# Patient Record
Sex: Female | Born: 1960 | Race: White | Hispanic: No | Marital: Married | Smoking: Current every day smoker
Health system: Southern US, Community
[De-identification: ages and names within clinical notes are randomized; demographics above are authoritative.]

## PROBLEM LIST (undated history)

## (undated) DIAGNOSIS — K5090 Crohn's disease, unspecified, without complications: Secondary | ICD-10-CM

## (undated) DIAGNOSIS — M069 Rheumatoid arthritis, unspecified: Secondary | ICD-10-CM

## (undated) DIAGNOSIS — F329 Major depressive disorder, single episode, unspecified: Secondary | ICD-10-CM

## (undated) DIAGNOSIS — I2510 Atherosclerotic heart disease of native coronary artery without angina pectoris: Secondary | ICD-10-CM

## (undated) DIAGNOSIS — M797 Fibromyalgia: Secondary | ICD-10-CM

## (undated) DIAGNOSIS — F419 Anxiety disorder, unspecified: Secondary | ICD-10-CM

## (undated) DIAGNOSIS — F32A Depression, unspecified: Secondary | ICD-10-CM

## (undated) HISTORY — DX: Crohn's disease, unspecified, without complications: K50.90

## (undated) HISTORY — DX: Atherosclerotic heart disease of native coronary artery without angina pectoris: I25.10

## (undated) HISTORY — DX: Depression, unspecified: F32.A

## (undated) HISTORY — DX: Fibromyalgia: M79.7

## (undated) HISTORY — DX: Major depressive disorder, single episode, unspecified: F32.9

## (undated) HISTORY — DX: Rheumatoid arthritis, unspecified: M06.9

## (undated) HISTORY — DX: Anxiety disorder, unspecified: F41.9

## (undated) HISTORY — PX: SHOULDER SURGERY: SHX246

---

## 2001-08-14 ENCOUNTER — Encounter (INDEPENDENT_AMBULATORY_CARE_PROVIDER_SITE_OTHER): Payer: Self-pay | Admitting: Specialist

## 2001-08-15 ENCOUNTER — Encounter: Payer: Self-pay | Admitting: Emergency Medicine

## 2001-08-15 ENCOUNTER — Inpatient Hospital Stay (HOSPITAL_COMMUNITY): Admission: EM | Admit: 2001-08-15 | Discharge: 2001-08-20 | Payer: Self-pay | Admitting: Emergency Medicine

## 2002-10-15 ENCOUNTER — Encounter: Payer: Self-pay | Admitting: Emergency Medicine

## 2002-10-15 ENCOUNTER — Inpatient Hospital Stay (HOSPITAL_COMMUNITY): Admission: EM | Admit: 2002-10-15 | Discharge: 2002-10-16 | Payer: Self-pay | Admitting: Emergency Medicine

## 2003-03-06 ENCOUNTER — Ambulatory Visit (HOSPITAL_COMMUNITY): Admission: RE | Admit: 2003-03-06 | Discharge: 2003-03-06 | Payer: Self-pay | Admitting: *Deleted

## 2004-09-12 ENCOUNTER — Other Ambulatory Visit: Admission: RE | Admit: 2004-09-12 | Discharge: 2004-09-12 | Payer: Self-pay | Admitting: Family Medicine

## 2005-06-19 ENCOUNTER — Emergency Department (HOSPITAL_COMMUNITY): Admission: EM | Admit: 2005-06-19 | Discharge: 2005-06-19 | Payer: Self-pay | Admitting: Emergency Medicine

## 2005-09-09 ENCOUNTER — Ambulatory Visit (HOSPITAL_COMMUNITY): Admission: RE | Admit: 2005-09-09 | Discharge: 2005-09-09 | Payer: Self-pay | Admitting: Obstetrics and Gynecology

## 2005-09-09 ENCOUNTER — Encounter (INDEPENDENT_AMBULATORY_CARE_PROVIDER_SITE_OTHER): Payer: Self-pay | Admitting: *Deleted

## 2005-12-27 ENCOUNTER — Observation Stay (HOSPITAL_COMMUNITY): Admission: EM | Admit: 2005-12-27 | Discharge: 2005-12-27 | Payer: Self-pay | Admitting: Emergency Medicine

## 2006-01-01 ENCOUNTER — Inpatient Hospital Stay (HOSPITAL_BASED_OUTPATIENT_CLINIC_OR_DEPARTMENT_OTHER): Admission: RE | Admit: 2006-01-01 | Discharge: 2006-01-01 | Payer: Self-pay | Admitting: Interventional Cardiology

## 2006-08-21 ENCOUNTER — Encounter: Admission: RE | Admit: 2006-08-21 | Discharge: 2006-08-21 | Payer: Self-pay | Admitting: Rheumatology

## 2006-08-26 ENCOUNTER — Other Ambulatory Visit: Admission: RE | Admit: 2006-08-26 | Discharge: 2006-08-26 | Payer: Self-pay | Admitting: Family Medicine

## 2006-11-30 ENCOUNTER — Encounter: Admission: RE | Admit: 2006-11-30 | Discharge: 2006-11-30 | Payer: Self-pay | Admitting: Family Medicine

## 2007-01-14 ENCOUNTER — Encounter: Admission: RE | Admit: 2007-01-14 | Discharge: 2007-01-14 | Payer: Self-pay | Admitting: Family Medicine

## 2007-10-25 ENCOUNTER — Other Ambulatory Visit: Admission: RE | Admit: 2007-10-25 | Discharge: 2007-10-25 | Payer: Self-pay | Admitting: Obstetrics and Gynecology

## 2007-11-28 ENCOUNTER — Encounter (INDEPENDENT_AMBULATORY_CARE_PROVIDER_SITE_OTHER): Payer: Self-pay | Admitting: Obstetrics and Gynecology

## 2007-11-28 ENCOUNTER — Ambulatory Visit (HOSPITAL_COMMUNITY): Admission: RE | Admit: 2007-11-28 | Discharge: 2007-11-28 | Payer: Self-pay | Admitting: Obstetrics and Gynecology

## 2007-12-12 ENCOUNTER — Encounter: Admission: RE | Admit: 2007-12-12 | Discharge: 2007-12-12 | Payer: Self-pay | Admitting: Obstetrics and Gynecology

## 2008-06-08 ENCOUNTER — Ambulatory Visit (HOSPITAL_BASED_OUTPATIENT_CLINIC_OR_DEPARTMENT_OTHER): Admission: RE | Admit: 2008-06-08 | Discharge: 2008-06-08 | Payer: Self-pay | Admitting: Specialist

## 2008-10-25 ENCOUNTER — Other Ambulatory Visit: Admission: RE | Admit: 2008-10-25 | Discharge: 2008-10-25 | Payer: Self-pay | Admitting: Obstetrics and Gynecology

## 2010-07-29 LAB — BASIC METABOLIC PANEL
BUN: 11 mg/dL (ref 6–23)
CO2: 29 mEq/L (ref 19–32)
Calcium: 9.3 mg/dL (ref 8.4–10.5)
Chloride: 103 mEq/L (ref 96–112)
Creatinine, Ser: 0.63 mg/dL (ref 0.4–1.2)
GFR calc Af Amer: >60 mL/min (ref >60)
GFR calc non Af Amer: >60 mL/min (ref >60)
Glucose, Bld: 88 mg/dL (ref 70–99)
Potassium: 3.8 mEq/L (ref 3.5–5.1)
Sodium: 137 mEq/L (ref 135–145)

## 2010-07-29 LAB — CBC
HCT: 38.5 % (ref 36.0–46.0)
Hemoglobin: 12.8 g/dL (ref 12.0–15.0)
MCHC: 33.2 g/dL (ref 30.0–36.0)
MCV: 95.5 fL (ref 78.0–100.0)
Platelets: 167 10*3/uL (ref 150–400)
RBC: 4.04 MIL/uL (ref 3.87–5.11)
RDW: 15.1 % (ref 11.5–15.5)
WBC: 5.4 10*3/uL (ref 4.0–10.5)

## 2010-07-29 LAB — POCT PREGNANCY, URINE: Preg Test, Ur: NEGATIVE

## 2010-08-26 NOTE — H&P (Signed)
Allison Greer, Allison Greer                 ACCOUNT NO.:  [id number]   MEDICAL RECORD NO.:  [id number]         PATIENT TYPE:  AMB   LOCATION:                                FACILITY:  [facility]   PHYSICIAN:  Gerald Leitz, MD          DATE OF BIRTH:  11-15-1960   DATE OF ADMISSION:  DATE OF DISCHARGE:                              HISTORY & PHYSICAL   HISTORY OF PRESENT ILLNESS:  This is a 50 year old G2, P1-0-1-1 with  irregular menses and intermenstrual bleeding noted to have a hyperechoic  mass on the inferior wall of her endometrium noted on sonohystogram,  which measured 7 x 4 mm.  She is scheduled for hysteroscopy, D&C, and  removal of suspected endometrial polyp.   PAST MEDICAL HISTORY:  1. Coronary artery disease.  2. Fibromyalgia.  3. Crohn disease.  4. Anxiety.  5. Arthritis.  6. History of hypoglycemia.   PAST SURGICAL HISTORY:  1. Dilation and curettage in May of 2007.  2. Cardiac catheterization in September of 2007.   MEDICATIONS:  1. Xanax 1 mg.  2. Cymbalta 60 mg.  3. Lyrica 75 mg.  4. Norvasc 5 mg.  5. Aspirin 81 mg.  6. Nitroglycerin as needed.  7. Tramadol 50 mg.  8. ReQuip 5 mg.  9. Asacol 400 mg.   ALLERGIES:  Reported to be of MORPHINE, which causes itching.   PAST GYN HISTORY:  Personal history of HSV-2.  No history of abnormal  Pap smears, the last Pap smear was on October 25, 2007, this was normal.   PAST OBSTETRICS HISTORY:  Vaginal delivery x1, elective abortion x1.   SOCIAL HISTORY:  The patient is married.  She reports tobacco use, a  pack per day.  Denies alcohol use.  No illicit drug use.   FAMILY HISTORY:  Negative for breast, ovarian, or colon cancer.   REVIEW OF SYSTEMS:  Positive for incontinence.  She refused to followup  with urology.  Otherwise, negative except as stated in history of  present illness.   PHYSICAL EXAMINATION:  VITAL SIGNS:  Blood pressure 124/86, heart rate  80, and weight 126 pounds.  GENERAL:  Alert and oriented in no acute  distress.  CARDIOVASCULAR:  Regular rate and rhythm.  LUNGS:  Clear to auscultation bilaterally.  ABDOMEN:  Soft, nontender, and nondistended.  Positive bowel sounds.  EXTREMITIES:  No clubbing, cyanosis, or edema.  PELVIC:  Normal external female genitalia.  Normal vaginal cervical  lesions were noted.  Right adnexal fullness and tenderness on exam.   Ultrasound done on July 16, 2007, shows a uterus that measures 7.21 x  3.64 x 4.32 cm, endometrium was 0.2 cm, ovaries appeared bilaterally.  Sonohystogram performed on November 15, 2007, shows a hyperechoic mass on  the inferior wall of the endometrium measuring 7 x 4 mm consistent for  endometrial polyp.   IMPRESSION AND PLAN:  A 50 year old with intermenstrual bleeding,  irregular menses, intermenstrual bleeding was likely secondary to  endometrial polyp.  She is scheduled for hysteroscopy, D&C, and removal  of endometrial polyp.  The risks, benefits, and alternatives were  discussed with the patient including, but not limited to infection,  bleeding, possibility of uterus perforation, and also need for further  surgery.  The patient voiced understanding of risks and decided to  proceed with hysteroscopy, D&C, and removal of endometrial polyp, and we  will request Cardiac clearance with her history of coronary artery  disease from her cardiologist who is Dr. Eldridge Dace with [facility].      Gerald Leitz, MD  Electronically Signed     TC/MEDQ  D:  11/15/2007  T:  11/16/2007  Job:  [id number]

## 2010-08-26 NOTE — Op Note (Signed)
NAMEJULEAH, Allison Greer                 ACCOUNT NO.:  [id number]   MEDICAL RECORD NO.:  [id number]          PATIENT TYPE:  AMB   LOCATION:  SDC                           FACILITY:  [facility]   PHYSICIAN:  Gerald Leitz, MD          DATE OF BIRTH:  June 24, 1960   DATE OF PROCEDURE:  DATE OF DISCHARGE:                               OPERATIVE REPORT   PREOPERATIVE DIAGNOSES:  1. Irregular menses.  2. Suspected endometrial polyp.   POSTOPERATIVE DIAGNOSES:  1. Irregular menses.  2. Endometrial polyp.   PROCEDURES:  1. Hysteroscopy.  2. Dilation and curettage.  3. Resection of endometrial polyp.   SURGEON:  Gerald Leitz, MD   ASSISTANT:  None.   ANESTHESIA:  General.   FINDINGS:  Endometrial polyp.   SPECIMENS:  Endometrial curettings and endometrial resection looks like  the polyp.   DISPOSITION OF SPECIMEN:  Pathology.   ESTIMATED BLOOD LOSS:  Minimal.   COMPLICATIONS:  None.   SORBITOL DEFICIT:  100 mL.   PROCEDURE:  The patient was taken to the operating room where she was  placed under general anesthesia.  She was placed in dorsal lithotomy  position and then prepped and draped in the usual sterile fashion.  A  bivalve speculum was placed into the vaginal vault.  The anterior lip of  the cervix was grasped with a single-tooth tenaculum.  The uterus was  sounded to 6 cm.  The cervix was then dilated with a Hegar dilators to a  #14.  Hysteroscope was inserted with the findings noted above.  Attempts  were made to remove the endometrial polyp with polyp forceps.  This was  unsuccessful.  Decision was made to resect the polyp.  The cervix was  then dilated to #31.  Jackson-Pratt sharp curettage was performed.  Hysteroscope was inserted.  The endometrial polyp was resected without  difficulty.  There were no evidence of uterine perforation.  All  instruments were removed from vagina prior  to removal of instruments.  A 5 mL of 0.25% Marcaine were injected at  the 4 and 8 o'clock  position for postoperative anesthesia.  Then, all  instruments were removed from the vagina.  Sponge, lap, and needle  counts were correct x2.  The patient was taken to the recovery room  awake and in stable condition.      Gerald Leitz, MD  Electronically Signed     TC/MEDQ  D:  11/28/2007  T:  11/28/2007  Job:  [id number]

## 2010-08-26 NOTE — Op Note (Signed)
NAMENDEYE, TENORIO                 ACCOUNT NO.:  [id number]   MEDICAL RECORD NO.:  [id number]          PATIENT TYPE:  AMB   LOCATION:  [facility]                         FACILITY:  [facility]   PHYSICIAN:  Jene Every, M.D.    DATE OF BIRTH:  Jun 04, 1960   DATE OF PROCEDURE:  06/08/2008  DATE OF DISCHARGE:                               OPERATIVE REPORT   PREOPERATIVE DIAGNOSIS:  Adhesive capsulitis, impingement syndrome,  partial tear of the rotator cuff, right shoulder.   POSTOPERATIVE DIAGNOSIS:  Adhesive capsulitis, impingement syndrome,  partial tear of the rotator cuff, right shoulder, anterior labral tear.   PROCEDURE PERFORMED:  1. Examination under anesthesia with manipulation under anesthesia.  2. Right shoulder arthroscopy with debridement and shaving of the      anterior glenoid labral tear.  3. Subacromial decompression, bursectomy, acromioplasty, debridement      of partial tear of the rotator cuff.   ANESTHESIA:  General.   ASSISTANT:  Roma Schanz, P.A.   BRIEF HISTORY:  A 50 year old with history of shoulder pain, history of  cervical spondylosis, and fibromyalgia consisting of shoulder pain and  loss of range of motion.  She had an MRI indicating partial tear of the  rotator cuff.  Due to the consistent symptoms, despite conservative  treatment, subacromial corticosteroid injection, she had persistent  symptoms, we therefore discussed exam under anesthesia, manipulation,  shoulder arthroscopy, debridement with possible open rotator cuff  repair.  Risks and benefits were discussed, including bleeding,  infection, damage to vascular structures, suboptimal range of motion,  recurrent tear, persistent pain, etc.   TECHNIQUE:  Patient in a supine beach-chair position, after the  induction of adequate general anesthesia and 1 gm of Kefzol, the right  shoulder and upper extremity was prepped and draped in the usual sterile  fashion.  We arranged her shoulder, and she  actually had fairly  reasonable range of motion of the shoulder, lacked perhaps some forward  flexion, which was obtained full with gentle manipulation.  Next, a  surgical marker was utilized to delineate the acromion, AC joint, and  coracoid.  A standard posterolateral, anterolateral, and anterior  portals were utilized with an incision through the skin only.  With the  arm in a 70/30 position with retraction, the cannula was introduced into  the glenohumeral space, penetrated into the capsule atraumatically in  line with the coracoid.  There was an anterior tear of the labrum, was  not detached.  The shaver was introduced through the anterior portal  cannula, which was introduced just beneath the biceps tendon under  direct visualization.  This was then debrided to a stable base.  The  remainder of the labrum was intact.  The glenoid and humerus were  unremarkable.  The rotator cuff was not torn from beneath.  The subscap  was unremarkable as well.   The camera was then redirected in the subacromial space and the cannula  was then moved to the lateral entry, triangulated, and we viewed the  subacromial space, which noted was exuberant synovitis and bursitis.  The shaver was introduced and  utilized to perform a full bursectomy  anteriorly and posteriorly on top of the tendon.  The small fraying of  the tendon anterolateral.  This was debrided.  There was no significant  tearing of the rotator cuff noted.  We released and marginalized the CA  ligament, improving the subacromial space.  There was a small spur in  the anterolateral aspect of the acromion, which was removed and shaved  with a shaver.  Cautery was utilized to achieve hemostasis.  Full  reinspection of the cuff.  No significant tear requiring open repair.  Full bursectomy was noted, and a good subacromial space following the  subacromial decompression.   Next, all instrumentation was removed.  The portals were closed with  4-0  nylon simple sutures, and 0.25% Marcaine with epinephrine was  infiltrated into the joint.  The wound was dressed sterilely, placed in  a sling and extubated without difficulty, and transported to the  recovery room in stable condition.   Patient tolerated the procedure well.  No complications.      Jene Every, M.D.  Electronically Signed     JB/MEDQ  D:  06/08/2008  T:  06/08/2008  Job:  [id number]

## 2010-08-29 NOTE — Procedures (Signed)
[facility]  Patient:    Allison Greer, Allison Greer Visit Number: [id number] MRN: [id number]          Service Type: MED Location: [phone] Attending Physician:  Rosanne Sack Dictated by:   Everardo All. Madilyn Fireman, M.D. Proc. Date: 08/18/01 Admit Date:  08/14/2001   CC:         Rosanne Sack, M.D.   Procedure Report  PROCEDURE:  Colonoscopy with biopsy.  INDICATION FOR PROCEDURE:  Abdominal pain and fever with thickened colon on CT scan.  DESCRIPTION OF PROCEDURE:  The patient was placed in the left lateral decubitus position and placed on the pulse monitor with continuous low-flow oxygen delivered by nasal cannula.  She was sedated with 120 mg of IV Demerol and 12 mg of IV Versed.  The Olympus video colonoscope was inserted into the rectum and advanced to near the ileocecal valve but, due to difficulty sedating the patient and a lot of discomfort as well as CT findings suggestive of a normal cecum, I did not reach the cecum.  The ileocecal valve was seen at a distance, and the ascending colon appeared normal.  Somewhere in the mid transverse there was transition to diffuse edema and erythema, nodularity and intermittent deep ulcerations which persisted down to about 40 cm with the largest ulcers to be noted about 50-60 cm.  This was most consistent with Crohns disease, although other forms of colitis could not be ruled out. Multiple biopsies were taken.  There was a transition to normal mucosa from about 45 cm to 35 cm, and the sigmoid and rectum appeared normal.  No polyps or diverticula were seen.  The scope was then withdrawn, and the patient returned to the recovery room in stable condition.  She tolerated the procedure well, and there were no immediate complications.  IMPRESSION:  Segmental colitis, probably Crohns disease.  PLAN:  Will begin Solu-Medrol while continuing antibiotics for now and awaiting pathology report. Dictated by:   Everardo All Madilyn Fireman, M.D. Attending Physician:  Rosanne Sack DD:  08/18/01 TD:  08/19/01 Job: [id number]

## 2010-08-29 NOTE — Discharge Summary (Signed)
[facility]  Patient:    Allison Greer, Allison Greer Visit Number: [id number] MRN: [id number]          Service Type: MED Location: [phone] Attending Physician:  Allison Greer Dictated by:   Allison Greer, M.D. Admit Date:  08/14/2001 Discharge Date: 08/20/2001   CC:         Allison Greer, M.D.  Allison Greer, M.D.   Discharge Summary  DATE OF BIRTH:  1960/08/15  DISCHARGE DIAGNOSES: 1. Acute colitis secondary to probable Crohns disease (newly diagnosed). 2. Transient hypokalemia, resolved. 3. Smoking cessation addressed during this hospital stay. 4. Depression. 5. Anxiety. 6. Fibromyalgia. 7. Gastroesophageal reflux disease.  DISCHARGE MEDICATIONS: 1. Prednisone 40 mg p.o. q.d. x2 days, then 30 mg p.o. q.d. x4 days, then    20 mg p.o. q.d. x4 days, then 10 mg p.o. q.d. and stay on this dose. 2. Xanax 0.5 mg p.o. t.i.d. p.r.n. 3. Nicotine patch over-the-counter daily.  FOLLOWUP:  The patient will be followed by Dr. Dorena Cookey in four to six weeks from the GI standpoint.  During this followup visit, Dr. Madilyn Greer will start the patient on Asacol.  Further management of the acute colitis diagnosed during this hospital stay will be directed by Dr. Madilyn Greer.  Dr. Marinda Greer will continue to follow this patient from the general medicine standpoint.  CONSULTATION:  Dr. Madilyn Greer, [facility] Greer [facility].  PROCEDURES: 1. Computed tomography scan of the abdomen and pelvis consistent with    thickening of the transverse and descending colon.  No diverticuli. 2. Colonoscopy consistent with ulcerative colitis.  LABORATORY DATA AND X-RAY FINDINGS:  Pathology results consistent with Crohns disease versus ischemic colitis.  Stool studies negative.  Hemoglobin 11.6, MCV 90, WBC 5.6, platelets 253.  Sodium 137, potassium 3.8, chloride 109, CO2 27, BUN 1, creatinine 0.8, glucose 131.  LFTs within normal limits.  Urinary pregnancy test negative.  Urinalysis negative.   Wet-prep negative.  GC and chlamydia probe negative.  HOSPITAL COURSE:  The patient is a very pleasant, 50 year old female admitted to [facility] on Aug 14, 2001, with intractable abdominal pain. Please see admission H&P by Dr. Marcelino Duster for further details regarding the history of presentation, physical exam and lab data.  The patient was admitted with intractable abdominal pain.  In the emergency department, the patient had a negative Pap smear with negative GC and chlamydia screen test.  The patient had leukocytosis of 11,000 with left shift associated with a fever of 103.  A CT scan of the abdomen and pelvis were obtained in the emergency department revealing transverse and descending colon wall thickening.  The patient was started on empiric Cipro and Flagyl.  The stool studies were negative for ova and parasites and C. difficile toxin assay.  A GI consult was obtained for further input.  A colonoscopy was performed on Aug 18, 2001, revealing ulcerative colitis suspicious for Crohns disease.  The patient was started on intravenous Solu-Medrol with significant improvement of the patients abdominal pain.  Cipro and Flagyl were discontinued without complications.  The patient became afebrile.  There was no evidence of sepsis.  The day prior to discharge, the patient was started on prednisone after Solu-Medrol was discontinued.  The pathology report revealed patchy chronic active inflammation that extended into the lamina propria mixed with inflammatory cell infiltrates associated with mild distortion.  There were focal ulcerations extending into the muscular mucosa with exudates and a few abscesses.  There was some focal lamina  propria hemorrhage and surface epithelial necrosis.  No granuloma were identified, but chronic changes of inflammation suggested Crohns as most likely possibility.  Other etiologies like ischemia colitis could not be ruled out completely.  The  patient improved clinically with therapy described above.  Dr. Dorena Cookey agreed with prednisone therapy with followup in his office within four to six weeks.  The addition of Asacol will be determined by Dr. Madilyn Greer at the time of followup.  Otherwise, the patient had mild hypokalemia that responded to potassium supplementation with total correction of this mild problem.  On the day of discharge, the patients medical status was determined improved.  I spent less than 30 minutes on the discharge process of this patient. Dictated by:   Allison Greer, M.D. Attending Physician:  Allison Greer DD:  08/20/01 TD:  08/23/01 Job: [id number]

## 2010-08-29 NOTE — Discharge Summary (Signed)
NAMEBERENIZE, GATLIN                 ACCOUNT NO.:  [id number]   MEDICAL RECORD NO.:  [id number]          PATIENT TYPE:  OBV   LOCATION:  4703                         FACILITY:  [facility]   PHYSICIAN:  Melissa L. Ladona Ridgel, MD  DATE OF BIRTH:  May 26, 1960   DATE OF ADMISSION:  12/26/2005  DATE OF DISCHARGE:  12/27/2005                                 DISCHARGE SUMMARY   NOTE:  Please note the patient left against medical advice.   CHIEF COMPLAINT ON ADMISSION:  Chest pain and epistaxis.   DISCHARGING DIAGNOSES:  1. Atypical chest pain.  The patient refused to stay for cardiac      evaluation, although Dr. Donnie Aho was consulted.  The patient refused      further evaluation and left the hospital.  Her chest discomfort had      resolved as of this morning and she was incredibly anxious about being      in the hospital.  2. Fibromyalgia.  At baseline.  3. Chron's disease.  She had no evidence for diarrhea during this      admission and was maintained on her Asacol.  4. Severe anxiety.  The patient was maintained on her Xanax and Cymbalta      but to no avail.  She refused to stay in the hospital for further      evaluation.   HOSPITAL COURSE:  The patient is a 50 year old white female with known  fibromyalgia, anxiety disorder and Crohn's disease.  She was admitted to the  hospital yesterday after arriving at the hospital with one episode of  epistaxis and ongoing chest discomfort was noted to be sternal in location,  originally sharp in character and radiating to her back.  The patient was  admitted overnight.  She underwent serial cardiac markers which were  negative for any myocardial injury.  Her D-dimer was within normal limits.  Her lipid profile was also within normal limits.  The patient was seen and  evaluated this morning and was tearful and resistant to remaining in the  hospital for evaluation.  Our plan was to have Dr. Donnie Aho see her to  determine if it would be okay for her to have  an outpatient workup.  The  patient decided after speaking with myself and her daughter that she wished  to leave the hospital against medical advice.   PERTINENT LABORATORY VALUES:  All cardiac markers were within normal limits,  as stated her D-dimer was normal and her lipid profile was normal.  Her  urine pregnancy was also negative.  A urine drug screen was not obtained  however would be recommended in an outpatient setting if there is a  suspicion for possible illicit drug use.   ASSESSMENT AND PLAN:  The patient left the hospital against medical advice  and really should follow up with her primary care physician and have an  outpatient cardiac workup.      Melissa L. Ladona Ridgel, MD  Electronically Signed    MLT/MEDQ  D:  12/27/2005  T:  12/27/2005  Job:  [id number]   cc:  Robert L. Foy [place], M.D.

## 2010-08-29 NOTE — H&P (Signed)
Allison Greer, BRATCHER                 ACCOUNT NO.:  [id number]   MEDICAL RECORD NO.:  [id number]          PATIENT TYPE:  AMB   LOCATION:  SDC                           FACILITY:  [facility]   PHYSICIAN:  Gerald Leitz, MD          DATE OF BIRTH:  [birth year]   DATE OF ADMISSION:  DATE OF DISCHARGE:                                HISTORY & PHYSICAL   The patient is scheduled for surgery on Sep 09, 2005.   HISTORY OF PRESENT ILLNESS:  This is a 50 year old G2, P1-0-1-1 referred to  me by Dr. Marinda Elk with [name] [place] for evaluation of  irregular menses and dysmenorrhea.  The patient states that her periods have  been irregular for the past year.  She has had intermenstrual bleeding.  An  endometrial biopsy was attempted in the office but failed and she will  undergo a D & C for further evaluation to rule out hyperplasia or  malignancy.   OBSTETRIC/GYNECOLOGIC HISTORY:  1.  Questionable history of HSV.  2.  No history of abnormal Pap smears.  Last Pap smear was in June 2006.      This was normal.  3.  Spontaneous vaginal delivery x1, EAB x1.   PAST MEDICAL HISTORY:  1.  Fibromyalgia.  2.  Crohn's disease.  3.  Anxiety.  4.  Arthritis.   PAST SURGICAL HISTORY:  None.   MEDICATIONS:  1.  Xanax 1 mg t.i.d.  2.  Cymbalta 30 mg one daily.  3.  Tramadol 50 mg b.i.d.  4.  Asacol 400 mg t.i.d.   ALLERGIES:  MORPHINE, which the patient does have a true allergy to, since  it causes itching.   SOCIAL HISTORY:  The patient is married.  She is unemployed.  She smokes  approximately a pack per day.  Denies alcohol or illicit drug use.   FAMILY HISTORY:  Negative for breast, ovarian or colon cancer.   REVIEW OF SYSTEMS:  Negative except as stated in history of current illness.   PHYSICAL EXAMINATION:  VITAL SIGNS:  Blood pressure 116/78, heart rate 64,  weight 117 pounds  CARDIOVASCULAR:  Regular rate and rhythm.  LUNGS:  Clear to auscultation bilaterally.  ABDOMEN:   Soft, nontender, nondistended.  No masses.  PELVIC:  Normal external female genitalia.  No vulvar, vaginal or cervical  lesions noted.  Bimanual exam reveals a normal-sized uterus, no adnexal  masses or tenderness.  EXTREMITIES:  No clubbing, cyanosis or edema.   ASSESSMENT/PLAN:  Irregular menses with intermenstrual bleeding with  attempted endometrial biopsy that failed in the office, recommend dilatation  and curettage to rule out hyperplasia or malignancy.  Risks, benefits,  alternatives of dilatation and curettage were discussed with the patient  including but not limited to infection, bleeding, damage to the uterus,  uterine perforation with need for further surgery, risk of transfusion was  discussed as well as HIV, hepatitis B and C.  The patient understands all  risks and wishes to proceed with dilatation and curettage.      Gerald Leitz,  MD  Electronically Signed     TC/MEDQ  D:  08/31/2005  T:  08/31/2005  Job:  [id number]

## 2010-08-29 NOTE — H&P (Signed)
Allison Greer, Allison Greer                 ACCOUNT NO.:  [id number]   MEDICAL RECORD NO.:  [id number]          PATIENT TYPE:  OBV   LOCATION:  4703                         FACILITY:  [facility]   PHYSICIAN:  Jackie Plum, M.D.DATE OF BIRTH:  03/10/1961   DATE OF ADMISSION:  12/26/2005  DATE OF DISCHARGE:                                HISTORY & PHYSICAL   The patient is a 50 year old lady with history of fibromyalgia, Crohn's  disease, anxiety, arthritis who presented with a __________ day history of  chest pain.  The pain was said to be sternal and originally sharp in  character and radiating to her back.  It lasted several minutes and has been  intermittent.  There is no associated nausea, vomiting, diaphoresis, PND,  orthopnea, dizziness, syncope or presyncope episode.  She is not having any  cough, sputum production, any fever or chills.  In addition, she had an  epistaxis episode about 5 p.m. which resolved spontaneously.   PAST MEDICAL HISTORY:  Is noted for history of prior chest pain, status post  evaluation in 2003, with stress test which was said to be negative.  However, she had some Nitro-Bid which she takes intermittently over the last  4 years or so for chest pain and this relieves the chest pain.   FAMILY HISTORY:  Positive for heart disease.   SOCIAL HISTORY:  Patient smokes 1 pack of cigarettes daily.  She does not  drink alcohol.   REVIEW OF SYSTEMS:  Essentially as above, otherwise unremarkable.   She does not drink alcohol.  PATIENT IS ALLERGIC TO MORPHINE.   MEDICINES:  Asacol, Nitro-Bid, tramadol, acetaminophen and Xanax.   PHYSICAL EXAMINATION:  BP was 90/50, pulse 82, respirations 18, temp 99.1  degrees Fahrenheit, O2 sat 99%.  GENERAL EXAM:  The patient was not in acute cardiopulmonary distress.  HEENT:  Normocephalic, atraumatic.  Pupils were equal, round, reactive to  light.  Extraocular movements intact.  Oropharynx moist.  NECK:  Supple, no JVD.  CARDIAC:  Regular rate and rhythm, no gallops or murmur.  ABDOMEN:  Soft, nontender.  Bowel sounds present.  EXTREMITIES:  No cyanosis.  CNS EXAM:  Nonfocal.   LABS:  A 12 lead EKG shows sinus rhythm without any acute ST wave changes.   CBC was unremarkable.  BMET was unremarkable except for a slightly low  glucose of 66, BUN of 10.  Creatinine was 0.8.  Point of care cardiac  markers were negative.  UA was negative for any UTI.   IMPRESSION:  Chest pain in patient with coronary artery disease, risk  factors of severe smoking and family history.   Patient is admitted for serial cardiac enzymes and possibly Cardiology  evaluation to see whether she would benefit from a cardiac catheterization  if she rules out.  Will check her lipid panel.  Further recommendations will  be made as her database expands.  She will need to be seen by cigarette  cessation team.      Jackie Plum, M.D.  Electronically Signed     GO/MEDQ  D:  12/27/2005  T:  12/27/2005  Job:  [id number]

## 2010-08-29 NOTE — Op Note (Signed)
[name], Allison Greer                 ACCOUNT NO.:  [id number]   MEDICAL RECORD NO.:  [id number]          PATIENT TYPE:  AMB   LOCATION:  SDC                           FACILITY:  [facility]   PHYSICIAN:  Gerald Leitz, MD          DATE OF BIRTH:  November 15, 1960   DATE OF PROCEDURE:  09/09/2005  DATE OF DISCHARGE:                                 OPERATIVE REPORT   PREOPERATIVE DIAGNOSIS:  Irregular menses.   POSTOPERATIVE DIAGNOSIS:  Irregular menses.   PROCEDURE:  Dilation and sharp curettage.   SURGEON:  Gerald Leitz, MD   ASSISTANT:  None.   ANESTHESIA:  General.   COMPLICATIONS:  None.   SPECIMEN:  Endometrial curettings.   ESTIMATED BLOOD LOSS:  Minimal.   COMPLICATIONS:  None.   INDICATIONS:  This is a 50 year old with irregular menses and intermenstrual  bleeding, who an endometrial biopsy was unable to be performed in the office  and she was scheduled for dilation and curettage to rule out hyperplasia or  malignancy.   PROCEDURE:  The patient was taken to the operating room and placed under  general anesthesia.  She was prepped and draped in the usual sterile fashion  after being placed in the dorsal lithotomy position.  Her bladder was  drained with an in-and-out catheter.  Bivalved speculum was placed into the  vaginal vault.  The cervix was grasped anteriorly with a single-tooth  tenaculum.  The uterus sounded to 8 cm.  Dilation was performed up to a #18  Hegar dilator.  Sharp curettage was performed on the endometrium, all the  way around, with gritty texture noted all the way around.  Endometrial  curettings were sent to pathology.  All instruments were removed from the  patient's vagina.  Sponge, lap and instrument counts were correct x2.  The  patient was extubated and taken to the recovery room, awake and in a stable  condition.     Gerald Leitz, MD  Electronically Signed    TC/MEDQ  D:  09/09/2005  T:  09/09/2005  Job:  [id number]

## 2010-08-29 NOTE — Cardiovascular Report (Signed)
[name], Allison Greer                 ACCOUNT NO.:  [id number]   MEDICAL RECORD NO.:  [id number]          PATIENT TYPE:  OIB   LOCATION:  1961                         FACILITY:  [facility]   PHYSICIAN:  Corky Crafts, MDDATE OF BIRTH:  [birth year]   DATE OF PROCEDURE:  01/01/2006  DATE OF DISCHARGE:  01/01/2006                              CARDIAC CATHETERIZATION   REFERRING PHYSICIAN:  Molly Maduro L. Foy [place], M.D.   PROCEDURES PERFORMED:  1. Left heart catheterization.  2. Left ventriculogram.  3. Coronary angiogram.  4. Abdominal aortogram.   OPERATOR:  Corky Crafts, M.D.   INDICATIONS:  Chest pain.   PROCEDURE:  The patient was brought to the catheterization lab after the  risks and benefits of cardiac catheterization were explained, and informed  consent was obtained.  She was placed on the table, prepped and draped in  the usual sterile fashion. Her right groin was infiltrated with 1%  lidocaine.  A 4-French arterial sheath was placed into the right femoral  artery using modified Seldinger technique.  Left coronary artery angiography  was performed using JL4.0 catheter. The catheter was advanced to the vessel  ostium under fluoroscopic guidance.  Digital angiography was performed in  multiple projections using hand injection of contrast. The right coronary  artery was then attempted using a 3-DRC. The right coronary artery could not  be engaged.  An AR-1 catheter was then used successfully. Digital  angiography was performed using hand injection of contrast in multiple  projections.  The left heart catheterization was then performed using a  pigtail catheter.  Pigtail was advanced to the ascending aorta and across  the aortic valve under fluoroscopic guidance.  A power injection of contrast  done in the 30-degree RAO position.  A catheter pullback under continuous  hemodynamic pressure monitoring was then performed. The catheter was then  pulled back to the level of the  renal arteries.  The abdominal aortogram was  performed in AP projection.  The sheath was removed using manual  compression.   FINDINGS:  The left main is widely patent. There is a mild degree of what  was likely ostial catheter-induced spasm.   The left anterior descending was a large vessel with luminal irregularities.  The first diagonal is a small vessel.  The second diagonal is a medium-size  vessel with an ostial 40% stenosis.   The circumflex is medium-size vessel with an ostial 30% lesion. The OM-1 is  medium size with luminal irregularities.  The OM-2 is also medium size with  luminal irregularities.   The right coronary artery is a medium-size dominant vessel with luminal  irregularities.   The left ventriculogram showed normal ventricular function.  There is no  mitral regurgitation.   HEMODYNAMIC RESULTS:  Left ventricular pressure 94/3 with an LVEDP of 5  mmHg.  Aortic pressure was 99/50 with a mean aortic pressure of 77 mmHg.  The abdominal aortogram showed no abdominal aortic aneurysm.  There are  single renal arteries bilaterally.  There is no evidence of renal artery  stenosis.   IMPRESSION:  1. Nonobstructive coronary  artery disease  2. Normal left ventricular function.  3. No renal artery stenosis.   RECOMMENDATIONS:  Continue aggressive preventive therapy.  I would consider  aspirin. I would continue Toprol and start a statin, given that there is  already angiographic evidence of atherosclerosis.  The patient should also  stop smoking.      Corky Crafts, MD  Electronically Signed     JSV/MEDQ  D:  01/01/2006  T:  01/04/2006  Job:  [id number]

## 2011-01-09 LAB — URINALYSIS, ROUTINE W REFLEX MICROSCOPIC
Bilirubin Urine: NEGATIVE
Glucose, UA: NEGATIVE
Hgb urine dipstick: NEGATIVE
Ketones, ur: NEGATIVE
Nitrite: NEGATIVE
Protein, ur: NEGATIVE
Specific Gravity, Urine: <1.005 — ABNORMAL LOW
Urobilinogen, UA: 0.2
pH: 7

## 2011-01-09 LAB — CBC
HCT: 41.7
Hemoglobin: 13.9
MCHC: 33.4
MCV: 96.1
Platelets: 226
RBC: 4.34
RDW: 14.3
WBC: 8

## 2011-01-09 LAB — BASIC METABOLIC PANEL
BUN: 8
CO2: 29
Calcium: 9.1
Chloride: 103
Creatinine, Ser: 0.77
GFR calc Af Amer: >60
GFR calc non Af Amer: >60
Glucose, Bld: 127 — ABNORMAL HIGH
Potassium: 3.5
Sodium: 138

## 2013-04-28 ENCOUNTER — Other Ambulatory Visit: Payer: Self-pay | Admitting: Family Medicine

## 2013-04-28 DIAGNOSIS — M546 Pain in thoracic spine: Secondary | ICD-10-CM

## 2013-05-04 ENCOUNTER — Other Ambulatory Visit: Payer: Self-pay

## 2013-05-04 ENCOUNTER — Ambulatory Visit (INDEPENDENT_AMBULATORY_CARE_PROVIDER_SITE_OTHER): Payer: Self-pay

## 2013-05-04 ENCOUNTER — Other Ambulatory Visit: Payer: Self-pay | Admitting: Family Medicine

## 2013-05-04 DIAGNOSIS — R0602 Shortness of breath: Secondary | ICD-10-CM

## 2013-05-04 DIAGNOSIS — M546 Pain in thoracic spine: Secondary | ICD-10-CM

## 2013-05-04 DIAGNOSIS — R079 Chest pain, unspecified: Secondary | ICD-10-CM

## 2013-05-05 ENCOUNTER — Other Ambulatory Visit: Payer: Self-pay | Admitting: Family Medicine

## 2013-05-05 DIAGNOSIS — M546 Pain in thoracic spine: Secondary | ICD-10-CM

## 2013-05-09 ENCOUNTER — Ambulatory Visit (INDEPENDENT_AMBULATORY_CARE_PROVIDER_SITE_OTHER): Payer: Self-pay

## 2013-05-09 ENCOUNTER — Other Ambulatory Visit: Payer: Self-pay

## 2013-05-09 DIAGNOSIS — M546 Pain in thoracic spine: Secondary | ICD-10-CM

## 2013-05-19 ENCOUNTER — Ambulatory Visit: Payer: Self-pay | Admitting: Neurology

## 2013-05-22 ENCOUNTER — Encounter: Payer: Self-pay | Admitting: Neurology

## 2013-05-22 ENCOUNTER — Telehealth: Payer: Self-pay | Admitting: *Deleted

## 2013-05-22 ENCOUNTER — Ambulatory Visit (INDEPENDENT_AMBULATORY_CARE_PROVIDER_SITE_OTHER): Payer: Self-pay | Admitting: Neurology

## 2013-05-22 ENCOUNTER — Encounter: Payer: Self-pay | Admitting: *Deleted

## 2013-05-22 VITALS — BP 100/68 | HR 60 | Ht 63.0 in | Wt 105.0 lb

## 2013-05-22 DIAGNOSIS — R569 Unspecified convulsions: Secondary | ICD-10-CM | POA: Insufficient documentation

## 2013-05-22 DIAGNOSIS — G894 Chronic pain syndrome: Secondary | ICD-10-CM

## 2013-05-22 DIAGNOSIS — M549 Dorsalgia, unspecified: Secondary | ICD-10-CM | POA: Insufficient documentation

## 2013-05-22 NOTE — Telephone Encounter (Signed)
error 

## 2013-05-22 NOTE — Progress Notes (Signed)
NEUROLOGY CONSULTATION NOTE  [name] MRN: [id number] DOB: 03/12/1961  Referring provider: Dr. Briscoe Deutscher Primary care provider: Dr. Briscoe Deutscher  Reason for consult:  Back pain and possible seizure  Dear Dr Maceo Pro:  Thank you for your kind referral of Allison Greer for consultation of the above symptoms. Although her history is well known to you, please allow me to reiterate it for the purpose of our medical record. The patient was accompanied to the clinic by her husband and daughter who also provides collateral information.  Records from her PCP and [facility] ER were reviewed.  HISTORY OF PRESENT ILLNESS: This is a 53 year old right-handed woman with a history of significant anxiety, fibromyalgia, Crohn's disease, and arthritis, presenting for back pain and an episode of loss of consciousness a year ago with no neurology follow-up for the event.    1. Episode of loss of consciousness. On 05/28/12, she was at home and recalls going to the laundry room when she started feeling different.  She told her husband "something's wrong with me," then he reports that she started looking up, asking where the laundry was. She has no further recollection of events until she was at [facility] ER 3 hours later.  Her husband reports that she was trembling and went down to the ground slowly, with note of blood coming out her mouth.  No urinary incontinence.  They report her eyes were open throughout the episode but she was unresponsive.  At the ER, CBC, CMP, urine drug screen, urinalysis, alcohol level, EKG, and head CT were unremarkable.  She did not want to stay, and was discharged with a diagnosis of syncope and follow-up with neurology, however due to insurance issues, was unable to do this.  They deny any further similar episodes, however over the past few months, she has woken up with urinary incontinence, which is unusual for her.  Her husband denies any seizure-like episodes in sleep.  She  and her husband have noticed worsening short-term memory over the past few months, for instance she could not recall giving him money a few seconds prior, or if she put on deodorant after showering.  She gets confused easily.  She had some twitching under the left arm this morning, but no clear myoclonic jerks.  Epilepsy Risk Factors: She had a normal birth and early development. There is no history of febrile convulsions, CNS infections such as meningitis/encephalitis, significant traumatic brain injury, neurosurgical procedures, or family history of seizures.  2. Back pain.  She started having pain over the right lower thoracic region 3 months ago.  It occasionally radiates to the left side, but mostly stays on the right.  It does not affect the chest region.  She had noticed intermittent numbness in this region, initially lasting a few seconds, increasing in duration, now lasting for several hours at a time.  She denies any recent falls prior to the onset of pain.  She denies any cervical or lumbar pain.  She has occasional numbness and tingling in both hands.  She reports aching calves and feet cramps.  She has a diagnosis of fibromyalgia, with pain "moving all around."  She has rheumatoid arthritis with significant bilateral knee pain, sometimes waking up screaming in pain.  She has apparently tried several medications, they recall Neurontin, Savella, and several anti-depressants, Robaxin, which had caused "increased rage."  She is currently taking Lyrica 50mg , sometimes once a day, or every other day, higher doses make her "go into  rages" where she hits herself repeatedly on the head with her hands.  She does not take any pain medication due to constipation side effects.  An MRI of the thoracic spine without contrast was done on 05/09/2013. Images are unavailable for review. Per report, there is a small T1 hypointense, heterogenously increased T2 and STIR signal area in the central T4 vertebral body,  mildly sclerotic.  No surrounding marrow edema. No spinal stenosis. Thoracic disc spaces are within normal limits for age, minimal to mild occasional disc bulging, no discrete disc herniation, no thoracic foraminal stenosis. The small T4 vertebral body area of altered marrow signal favors an atypical appearance of a benign vertebral body hemangioma. She had a CT chest without contrast done in January 2015 with note of mild emphysema changes, tiny nodule felt to be scar tissue.  She denies a history of headaches, however recently has had an increase in dull frontal headaches occurring several times a week, no associated nausea, vomiting, photo/phonophobia.  She has occasional dizziness.  She notices occasional diplopia, where she needs to refocus.  No dysarthria/dysphagia.  She has significant anxiety, taking a total of 6mg  Xanax daily for several years, no recent dosage changes.  She gets anxious when she needs to leave the house, and does not drive.  At one time, she took an unrecalled medication that caused personality changes where she overdosed on the medication.   Records and images were personally reviewed where available.   PAST MEDICAL HISTORY: Past Medical History  Diagnosis Date  . Depression   . Fibromyalgia   . Anxiety   . Crohn's disease     Was seeing [facility](Been four years)   . Arthritis, rheumatoid     PAST SURGICAL HISTORY: Past Surgical History  Procedure Laterality Date  . Shoulder surgery      MEDICATIONS: No current outpatient prescriptions on file prior to visit.   No current facility-administered medications on file prior to visit.    ALLERGIES: Allergies  Allergen Reactions  . Morphine And Related Itching    FAMILY HISTORY: History reviewed. No pertinent family history.  SOCIAL HISTORY: History   Social History  . Marital Status: Married    Spouse Name: N/A    Number of Children: N/A  . Years of Education: N/A   Occupational  History  . Not on file.   Social History Main Topics  . Smoking status: Current Every Day Smoker  . Smokeless tobacco: Never Used  . Alcohol Use: No  . Drug Use: No  . Sexual Activity: Not on file   Other Topics Concern  . Not on file   Social History Narrative  . No narrative on file    REVIEW OF SYSTEMS: Constitutional: No fevers, chills, or sweats, + generalized fatigue, + weight loss, change in appetite Eyes: No visual changes, + double vision, - eye pain Ear, nose and throat: No hearing loss, ear pain, nasal congestion, sore throat Cardiovascular: No chest pain, palpitations Respiratory:  No shortness of breath at rest or with exertion, wheezes GastrointestinaI: No nausea, vomiting, + diarrhea, no abdominal pain, fecal incontinence  Genitourinary:  No dysuria, urinary retention or frequency Musculoskeletal:  No neck pain, lower back pain Integumentary: No rash, pruritus, skin lesions Neurological: as above Psychiatric: + depression, anxiety Endocrine: No palpitations, fatigue, diaphoresis,  Hematologic/Lymphatic:  No anemia, purpura, petechiae. Allergic/Immunologic: no itchy/runny eyes, nasal congestion, recent allergic reactions, rashes  PHYSICAL EXAM: Filed Vitals:   05/22/13 1257  BP: 100/68  Pulse: 60  General: tired-appearing, anxious. No acute distress Head:  Normocephalic/atraumatic Neck: supple, no paraspinal tenderness, full range of motion Back: No paraspinal tenderness, slight tenderness to palpation over the right lower posterior ribs (around T7-10) Heart: regular rate and rhythm Lungs: Clear to auscultation bilaterally. Vascular: No carotid bruits. Skin/Extremities: No rash, no edema Neurological Exam: Mental status: alert and oriented to person, place, and time, no dysarthria or dysphagia, intact fluency and comprehension. Cranial nerves: CN I: not tested CN II: pupils equal, round and reactive to light, visual fields intact, fundi  unremarkable. CN III, IV, VI:  full range of motion, no nystagmus, no ptosis CN V: facial sensation intact CN VII: upper and lower face symmetric CN VIII: hearing intact CN IX, X: gag intact, uvula midline CN XI: sternocleidomastoid and trapezius muscles intact CN XII: tongue midline Bulk & Tone: normal, no fasciculations. Motor: 5/5 throughout with no pronator drift. Sensation: intact to light touch, cold, pin, vibration and joint position sense.  No extinction to double simultaneous stimulation.  No sensory level noted.  Romberg test negative. Deep Tendon Reflexes: +2 throughout, no clonus Plantar responses: downgoing bilaterally Finger to nose testing: no incoordination Gait: narrow-based and steady, slight difficulty with tandem walk but able  IMPRESSION: This is a 53 year old right-handed woman with a history of fibromyalgia, depression, anxiety, Crohn's disease, presenting with a three-month history of right posterior lower thoracic pain, with tenderness to palpation around the right lower posterior ribs. Neurological exam is non-focal.  Imaging with an MRI of the thoracic spine and CT of the chest has been unrevealing, the T4 vertebral body finding is incidental, with no clear correlated symptoms at this time.  Pain is likely costochondritis or musculoskeletal in nature, possibly also related to her chronic pain syndrome. She has been very sensitive to pain medications, and will likely benefit from referral to a pain management specialist. With regards to the episode of loss of consciousness a year ago, considerations include syncope versus seizure. There have been further similar episodes since then, however she has had increasing confusion and memory problems and 2 unexplained episodes of waking up with urinary incontinence. An MRI of the brain with and without contrast and a routine EEG will be ordered to assess for underlying abnormalities at increased risk for recurrent seizures. There  is no clear indication to start antiepileptic medication at this time. She is not driving and understands [place] driving laws to stop driving after a seizure, until 6 months seizure-free. We also discussed her significant anxiety and fear of leaving the house, she will likely benefit from seeing a psychologist/psychiatrist, however she is resistant to this at this time.  Thank you for allowing me to participate in the care of this patient. Please do not hesitate to call for any questions or concerns.   Ellouise Newer, M.D.  CC: Dr. Briscoe Deutscher

## 2013-05-22 NOTE — Patient Instructions (Signed)
1. Schedule MRI brain with and without contrast 2. Schedule routine EEG 3. See pain management specialist

## 2013-05-25 NOTE — Telephone Encounter (Signed)
Pt's daughter returning a call to [facility]. Please call back at [phone] / Sherri S.

## 2013-05-26 NOTE — Telephone Encounter (Signed)
Noted  

## 2013-05-26 NOTE — Telephone Encounter (Signed)
Patients daughter called  Mrs Cope does not want to see  A pain management doctor at this time she will let the office know when she is ready to see one

## 2013-05-31 NOTE — Telephone Encounter (Signed)
Patient does not want to see a pain management at this time per daughter

## 2013-06-06 ENCOUNTER — Telehealth: Payer: Self-pay | Admitting: *Deleted

## 2013-06-06 NOTE — Telephone Encounter (Signed)
Noted  

## 2013-06-06 NOTE — Telephone Encounter (Signed)
Patient daughter called  06/06/13 and ask to reschedule her MRI and EEG this was rescheduled  To 06/23/13 at 845am and 10am

## 2013-06-07 ENCOUNTER — Telehealth: Payer: Self-pay | Admitting: *Deleted

## 2013-06-07 NOTE — Telephone Encounter (Signed)
Patient called this am 06/07/13 and ask me to reschedule her MRI and EEG until 06/26/13 so her daughter can take her

## 2013-06-08 ENCOUNTER — Ambulatory Visit (HOSPITAL_COMMUNITY): Payer: Self-pay

## 2013-06-08 ENCOUNTER — Ambulatory Visit (HOSPITAL_COMMUNITY): Admission: RE | Admit: 2013-06-08 | Payer: Self-pay | Source: Ambulatory Visit

## 2013-06-23 ENCOUNTER — Ambulatory Visit (HOSPITAL_COMMUNITY): Payer: Self-pay

## 2013-06-26 ENCOUNTER — Ambulatory Visit (HOSPITAL_COMMUNITY)
Admission: RE | Admit: 2013-06-26 | Discharge: 2013-06-26 | Disposition: A | Discharge status: Home / Self Care | Payer: Self-pay | Source: Ambulatory Visit | Attending: Neurology | Admitting: Neurology

## 2013-06-26 DIAGNOSIS — R569 Unspecified convulsions: Secondary | ICD-10-CM

## 2013-06-26 DIAGNOSIS — R229 Localized swelling, mass and lump, unspecified: Secondary | ICD-10-CM | POA: Insufficient documentation

## 2013-06-26 MED ORDER — GADOBENATE DIMEGLUMINE 529 MG/ML IV SOLN
10.0000 mL | Freq: Once | INTRAVENOUS | Status: AC | PRN
  Administered 2013-06-26: 10 mL via INTRAVENOUS

## 2013-06-26 NOTE — Progress Notes (Signed)
EEG Completed; Results Pending  

## 2013-06-26 NOTE — Procedures (Signed)
ELECTROENCEPHALOGRAM REPORT  Date of Study: 06/26/2013  Patient's Name: Allison Greer MRN: [id number] Date of Birth: 1960/06/07  Referring Provider: Dr. Ellouise Newer  Clinical History: This is a 53 year old woman with chronic pain and an episode of loss of consciousness a year ago.  She has had increasing confusion and memory problems and 2 unexplained episodes of waking up with urinary incontinence.    Medications: Lyrica, Xanax, Lialda  Technical Summary: A multichannel digital EEG recording measured by the international 10-20 system with electrodes applied with paste and impedances below 5000 ohms performed in our laboratory with EKG monitoring in an awake and drowsy patient.  Hyperventilation was not performed. Photic stimulation was performed.  The digital EEG was referentially recorded, reformatted, and digitally filtered in a variety of bipolar and referential montages for optimal display.  Spike detection software was employed.  Description: The patient is awake and drowsy during the recording.  During maximal wakefulness, there is a symmetric, medium voltage 10-10.5 Hz posterior dominant rhythm that attenuates with eye opening.  The record is symmetric.  There is an excess amount of diffuse low voltage beta activity seen throughout the recording. During drowsiness, there is an increase in theta slowing of the background. Photic stimulation did not elicit any abnormalities.  There were no epileptiform discharges or electrographic seizures seen.    EKG lead was unremarkable.  Impression: This awake and drowsy EEG is normal except for excess amount of diffuse low voltage beta activity.  Clinical Correlation: Diffuse low voltage beta activity is commonly seen with sedating medications such as benzodiazepines.  In the absence of sedating medications, anxiety and hyperthyroidism may produce generalized beta activity.  The absence of epileptiform discharges does not exclude a clinical  diagnosis of epilepsy.  If further clinical questions remain, prolonged EEG may be helpful.  Clinical correlation is advised.   Ellouise Newer, M.D.

## 2013-06-27 ENCOUNTER — Telehealth: Payer: Self-pay | Admitting: Neurology

## 2013-06-27 NOTE — Telephone Encounter (Signed)
Pt has some questions please call her at [phone]

## 2013-06-28 NOTE — Telephone Encounter (Signed)
Spoke with patient on yesterday with results of Mri brain patient would like for it to be released to My Chart for her own viewing.

## 2013-07-03 NOTE — Telephone Encounter (Signed)
MRI results released on MyChart.

## 2013-09-11 ENCOUNTER — Other Ambulatory Visit (HOSPITAL_COMMUNITY): Payer: Self-pay

## 2013-09-11 DIAGNOSIS — R0600 Dyspnea, unspecified: Secondary | ICD-10-CM

## 2013-09-18 ENCOUNTER — Ambulatory Visit (HOSPITAL_COMMUNITY): Admission: RE | Admit: 2013-09-18 | Payer: Self-pay | Source: Ambulatory Visit

## 2013-10-24 ENCOUNTER — Encounter: Payer: Self-pay | Admitting: Internal Medicine

## 2013-10-24 ENCOUNTER — Ambulatory Visit (INDEPENDENT_AMBULATORY_CARE_PROVIDER_SITE_OTHER): Payer: Self-pay | Admitting: Internal Medicine

## 2013-10-24 VITALS — BP 130/84 | HR 84 | Temp 99.2°F | Ht 63.5 in | Wt 107.0 lb

## 2013-10-24 DIAGNOSIS — R0600 Dyspnea, unspecified: Secondary | ICD-10-CM

## 2013-10-24 DIAGNOSIS — R0609 Other forms of dyspnea: Secondary | ICD-10-CM

## 2013-10-24 DIAGNOSIS — R911 Solitary pulmonary nodule: Secondary | ICD-10-CM

## 2013-10-24 DIAGNOSIS — R0989 Other specified symptoms and signs involving the circulatory and respiratory systems: Secondary | ICD-10-CM

## 2013-10-24 DIAGNOSIS — J432 Centrilobular emphysema: Secondary | ICD-10-CM

## 2013-10-24 DIAGNOSIS — J438 Other emphysema: Secondary | ICD-10-CM

## 2013-10-24 DIAGNOSIS — F17200 Nicotine dependence, unspecified, uncomplicated: Secondary | ICD-10-CM | POA: Insufficient documentation

## 2013-10-24 NOTE — Progress Notes (Signed)
   Subjective:    Patient ID: Allison Greer, female    DOB: 05-20-1960  MRN: [id number]  HPI  74 yowf smoker first noted in childhood never had ex tol as peers due to sob but gradually worse ex tol to point where is uncomfortable to top of 14 steps referred by Dr Maceo Pro to pulmonary clinic 10/24/2013    10/24/2013 1st [facility] office visit/ Allison Greer  Chief Complaint  Patient presents with  . Pulmonary Consult    Referred per Dr. Briscoe Deutscher. Pt c/o DOE x 6 months- gets out of breath with walking up 1 flight of stairs.  She also c/o CP and non prod cough on and off for the past 6 months.   gradual reduction in ex tol to point to point of sob top of steps,  Walking no problem at slow pace    Review of Systems  Constitutional: Negative for fever, chills and unexpected weight change.  HENT: Negative for congestion, dental problem, ear pain, nosebleeds, postnasal drip, rhinorrhea, sinus pressure, sneezing, sore throat, trouble swallowing and voice change.   Eyes: Negative for visual disturbance.  Respiratory: Positive for cough and shortness of breath. Negative for choking.   Cardiovascular: Positive for chest pain. Negative for leg swelling.  Gastrointestinal: Positive for abdominal pain. Negative for vomiting and diarrhea.  Genitourinary: Negative for difficulty urinating.  Musculoskeletal: Positive for arthralgias.  Skin: Negative for rash.  Neurological: Positive for headaches. Negative for tremors and syncope.  Hematological: Does not bruise/bleed easily.       Objective:   Physical Exam  amb wf nad  Wt Readings from Last 3 Encounters:  10/24/13 107 lb (48.535 kg)  05/22/13 105 lb (47.628 kg)      HEENT mild turbinate edema.  Oropharynx no thrush or excess pnd or cobblestoning.  No JVD or cervical adenopathy. Mild accessory muscle hypertrophy. Trachea midline, nl thryroid. Chest was hyperinflated by percussion with diminished breath sounds and moderate increased exp time  without wheeze. Hoover sign positive at mid inspiration. Regular rate and rhythm without murmur gallop or rub or increase P2 or edema.  Abd: no hsm, nl excursion. Ext warm without cyanosis or clubbing.        Assessment & Plan:

## 2013-10-24 NOTE — Assessment & Plan Note (Signed)

## 2013-10-24 NOTE — Patient Instructions (Signed)
I do recommend you have a follow up CT Jan 2016   You do have some emphysema but not enough to classify as COPD  The treatment for this severity is stop smoking   I recommend more regular exercise and consider CPST [facility] at [phone]

## 2013-10-24 NOTE — Progress Notes (Signed)
PFT done today. 

## 2013-10-24 NOTE — Assessment & Plan Note (Signed)
CT chest 05/04/13 . Moderate centrilobular emphysematous changes.   PFT's 10/24/2013  FEV1  2.37 (88%) raito 74 and no change p saba, DLCO 59 and corrects to 63   As I explained to this patient in detail:  although there may be emphysema present, it is not be clinically relevant:   it does not appear to be limiting activity tolerance any more than a set of worn tires limits someone from driving a car  around a parking lot.  A new set of Michelins might look good but would have no perceived impact on the performance of the car and would not be worth the cost.  To prove this she needs a cpst but declined today  In meantime, the only way to keep it from progressing is stop smoking (see sep a/p)

## 2013-10-24 NOTE — Assessment & Plan Note (Signed)
CT chest 05/04/13 2 x 4 mm nodule along the right minor fissure (series 3/ image 37),  favored to reflect nodular scarring > placed in tickle file for 05/04/14

## 2013-10-26 ENCOUNTER — Telehealth: Payer: Self-pay | Admitting: Internal Medicine

## 2013-10-26 NOTE — Telephone Encounter (Signed)
We had my moms appointment yesterday and I wanted to thank you again for helping [place] get all of the paperwork and everything beforehand it was a huge help. One question do you know how long it takes for her visit summary/lung function results to be posted on the patient portal. We were a bit confused as to why Dr Melvyn Novas stated that her test went great but yet she needed albuterol treatment while it was being completed. She was frantic and ready to go by the time we saw him so we were not sure who to ask about this. Thanks again!   I emailed her back and told her I was unable to discuss her  Mom over email and that I would send a msg back.

## 2013-10-26 NOTE — Telephone Encounter (Signed)
I spoke with the pt daughter and advised that giving albuterol with the PFT is standard procedure and is part of the test. I advised it does not determine whether the test went well or not. She states understanding. Also she wants a copy mailed to the pt home. Copy mailed. Drexel Bing, CMA

## 2013-10-30 LAB — PULMONARY FUNCTION TEST
DL/VA % pred: 63 %
DL/VA: 3.02 ml/min/mmHg/L
DLCO unc % pred: 59 %
DLCO unc: 13.92 ml/min/mmHg
FEF 25-75 Post: 1.97 L/sec
FEF 25-75 Pre: 1.97 L/sec
FEF2575-%Change-Post: 0 %
FEF2575-%Pred-Post: 76 %
FEF2575-%Pred-Pre: 76 %
FEV1-%Change-Post: 2 %
FEV1-%Pred-Post: 90 %
FEV1-%Pred-Pre: 88 %
FEV1-Post: 2.42 L
FEV1-Pre: 2.37 L
FEV1FVC-%Change-Post: 2 %
FEV1FVC-%Pred-Pre: 93 %
FEV6-%Change-Post: 3 %
FEV6-%Pred-Post: 97 %
FEV6-%Pred-Pre: 94 %
FEV6-Post: 3.21 L
FEV6-Pre: 3.11 L
FEV6FVC-%Change-Post: 0 %
FEV6FVC-%Pred-Post: 103 %
FEV6FVC-%Pred-Pre: 103 %
FVC-%Change-Post: 0 %
FVC-%Pred-Post: 94 %
FVC-%Pred-Pre: 94 %
FVC-Post: 3.21 L
FVC-Pre: 3.22 L
Post FEV1/FVC ratio: 76 %
Post FEV6/FVC ratio: 100 %
Pre FEV1/FVC ratio: 74 %
Pre FEV6/FVC Ratio: 100 %
RV % pred: 108 %
RV: 1.99 L
TLC % pred: 105 %
TLC: 5.23 L

## 2013-11-25 ENCOUNTER — Encounter: Payer: Self-pay | Admitting: *Deleted

## 2014-01-16 ENCOUNTER — Emergency Department (HOSPITAL_BASED_OUTPATIENT_CLINIC_OR_DEPARTMENT_OTHER)
Admission: EM | Admit: 2014-01-16 | Discharge: 2014-01-16 | Disposition: A | Discharge status: Other Acute Inpatient Hospital | Payer: 59 | Attending: Emergency Medicine | Admitting: Emergency Medicine

## 2014-01-16 ENCOUNTER — Inpatient Hospital Stay (HOSPITAL_COMMUNITY)
Admission: AD | Admit: 2014-01-16 | Discharge: 2014-01-19 | DRG: 885 | Disposition: A | Discharge status: Home / Self Care | Payer: 59 | Source: Intra-hospital | Attending: Psychiatry | Admitting: Psychiatry

## 2014-01-16 ENCOUNTER — Encounter (HOSPITAL_COMMUNITY): Payer: Self-pay | Admitting: *Deleted

## 2014-01-16 ENCOUNTER — Encounter (HOSPITAL_BASED_OUTPATIENT_CLINIC_OR_DEPARTMENT_OTHER): Payer: Self-pay | Admitting: Emergency Medicine

## 2014-01-16 DIAGNOSIS — Z599 Problem related to housing and economic circumstances, unspecified: Secondary | ICD-10-CM

## 2014-01-16 DIAGNOSIS — G4700 Insomnia, unspecified: Secondary | ICD-10-CM | POA: Diagnosis present

## 2014-01-16 DIAGNOSIS — M797 Fibromyalgia: Secondary | ICD-10-CM | POA: Diagnosis present

## 2014-01-16 DIAGNOSIS — Z825 Family history of asthma and other chronic lower respiratory diseases: Secondary | ICD-10-CM | POA: Diagnosis not present

## 2014-01-16 DIAGNOSIS — Z8249 Family history of ischemic heart disease and other diseases of the circulatory system: Secondary | ICD-10-CM | POA: Diagnosis not present

## 2014-01-16 DIAGNOSIS — Z8739 Personal history of other diseases of the musculoskeletal system and connective tissue: Secondary | ICD-10-CM | POA: Insufficient documentation

## 2014-01-16 DIAGNOSIS — F17210 Nicotine dependence, cigarettes, uncomplicated: Secondary | ICD-10-CM | POA: Diagnosis present

## 2014-01-16 DIAGNOSIS — I2510 Atherosclerotic heart disease of native coronary artery without angina pectoris: Secondary | ICD-10-CM | POA: Diagnosis present

## 2014-01-16 DIAGNOSIS — M069 Rheumatoid arthritis, unspecified: Secondary | ICD-10-CM | POA: Diagnosis present

## 2014-01-16 DIAGNOSIS — Z79899 Other long term (current) drug therapy: Secondary | ICD-10-CM | POA: Insufficient documentation

## 2014-01-16 DIAGNOSIS — Z720 Tobacco use: Secondary | ICD-10-CM | POA: Insufficient documentation

## 2014-01-16 DIAGNOSIS — F1320 Sedative, hypnotic or anxiolytic dependence, uncomplicated: Secondary | ICD-10-CM

## 2014-01-16 DIAGNOSIS — T50904A Poisoning by unspecified drugs, medicaments and biological substances, undetermined, initial encounter: Secondary | ICD-10-CM

## 2014-01-16 DIAGNOSIS — F411 Generalized anxiety disorder: Secondary | ICD-10-CM | POA: Diagnosis present

## 2014-01-16 DIAGNOSIS — F410 Panic disorder [episodic paroxysmal anxiety] without agoraphobia: Secondary | ICD-10-CM | POA: Diagnosis present

## 2014-01-16 DIAGNOSIS — Z8659 Personal history of other mental and behavioral disorders: Secondary | ICD-10-CM

## 2014-01-16 DIAGNOSIS — R45851 Suicidal ideations: Secondary | ICD-10-CM | POA: Insufficient documentation

## 2014-01-16 DIAGNOSIS — K5090 Crohn's disease, unspecified, without complications: Secondary | ICD-10-CM | POA: Insufficient documentation

## 2014-01-16 DIAGNOSIS — F332 Major depressive disorder, recurrent severe without psychotic features: Principal | ICD-10-CM | POA: Diagnosis present

## 2014-01-16 DIAGNOSIS — T424X2A Poisoning by benzodiazepines, intentional self-harm, initial encounter: Secondary | ICD-10-CM

## 2014-01-16 LAB — CBC WITH DIFFERENTIAL/PLATELET
BASOS PCT: 1 % (ref 0–1)
Basophils Absolute: 0 10*3/uL (ref 0.0–0.1)
EOS ABS: 0.4 10*3/uL (ref 0.0–0.7)
Eosinophils Relative: 6 % — ABNORMAL HIGH (ref 0–5)
HCT: 39.2 % (ref 36.0–46.0)
Hemoglobin: 12.9 g/dL (ref 12.0–15.0)
Lymphocytes Relative: 38 % (ref 12–46)
Lymphs Abs: 2.2 10*3/uL (ref 0.7–4.0)
MCH: 31.2 pg (ref 26.0–34.0)
MCHC: 32.9 g/dL (ref 30.0–36.0)
MCV: 94.9 fL (ref 78.0–100.0)
MONOS PCT: 7 % (ref 3–12)
Monocytes Absolute: 0.4 10*3/uL (ref 0.1–1.0)
NEUTROS PCT: 48 % (ref 43–77)
Neutro Abs: 2.8 10*3/uL (ref 1.7–7.7)
PLATELETS: 178 10*3/uL (ref 150–400)
RBC: 4.13 MIL/uL (ref 3.87–5.11)
RDW: 15 % (ref 11.5–15.5)
WBC: 5.7 10*3/uL (ref 4.0–10.5)

## 2014-01-16 LAB — COMPREHENSIVE METABOLIC PANEL
ALT: 7 U/L (ref 0–35)
ANION GAP: 11 (ref 5–15)
AST: 13 U/L (ref 0–37)
Albumin: 3.7 g/dL (ref 3.5–5.2)
Alkaline Phosphatase: 59 U/L (ref 39–117)
BUN: 8 mg/dL (ref 6–23)
CO2: 26 mEq/L (ref 19–32)
Calcium: 9 mg/dL (ref 8.4–10.5)
Chloride: 104 mEq/L (ref 96–112)
Creatinine, Ser: 0.8 mg/dL (ref 0.50–1.10)
GFR calc non Af Amer: 82 mL/min — ABNORMAL LOW (ref >90)
GLUCOSE: 81 mg/dL (ref 70–99)
Potassium: 3.7 mEq/L (ref 3.7–5.3)
SODIUM: 141 meq/L (ref 137–147)
TOTAL PROTEIN: 6.4 g/dL (ref 6.0–8.3)
Total Bilirubin: 0.3 mg/dL (ref 0.3–1.2)

## 2014-01-16 LAB — URINALYSIS, ROUTINE W REFLEX MICROSCOPIC
BILIRUBIN URINE: NEGATIVE
Glucose, UA: NEGATIVE mg/dL
HGB URINE DIPSTICK: NEGATIVE
Ketones, ur: NEGATIVE mg/dL
Nitrite: NEGATIVE
PH: 6 (ref 5.0–8.0)
PROTEIN: NEGATIVE mg/dL
Specific Gravity, Urine: 1.007 (ref 1.005–1.030)
Urobilinogen, UA: 0.2 mg/dL (ref 0.0–1.0)

## 2014-01-16 LAB — RAPID URINE DRUG SCREEN, HOSP PERFORMED
AMPHETAMINES: NOT DETECTED
BENZODIAZEPINES: POSITIVE — AB
Barbiturates: NOT DETECTED
COCAINE: NOT DETECTED
Opiates: NOT DETECTED
Tetrahydrocannabinol: NOT DETECTED

## 2014-01-16 LAB — URINE MICROSCOPIC-ADD ON

## 2014-01-16 LAB — ETHANOL: Alcohol, Ethyl (B): <11 mg/dL (ref 0–11)

## 2014-01-16 LAB — SALICYLATE LEVEL

## 2014-01-16 LAB — ACETAMINOPHEN LEVEL: Acetaminophen (Tylenol), Serum: <15 ug/mL (ref 10–30)

## 2014-01-16 MED ORDER — HYDROXYZINE HCL 25 MG PO TABS
25.0000 mg | ORAL_TABLET | Freq: Four times a day (QID) | ORAL | Status: DC | PRN
  Administered 2014-01-17: 25 mg via ORAL
  Filled 2014-01-16: qty 1

## 2014-01-16 MED ORDER — TRAZODONE HCL 50 MG PO TABS
50.0000 mg | ORAL_TABLET | Freq: Every evening | ORAL | Status: DC | PRN
  Filled 2014-01-16 (×4): qty 1

## 2014-01-16 MED ORDER — NITROGLYCERIN 0.4 MG SL SUBL: 0.4000 mg | SUBLINGUAL_TABLET | SUBLINGUAL | Status: DC | PRN

## 2014-01-16 MED ORDER — NICOTINE 21 MG/24HR TD PT24
21.0000 mg | MEDICATED_PATCH | Freq: Once | TRANSDERMAL | Status: DC
  Administered 2014-01-16: 21 mg via TRANSDERMAL
  Filled 2014-01-16: qty 1

## 2014-01-16 MED ORDER — LORAZEPAM 1 MG PO TABS
2.0000 mg | ORAL_TABLET | Freq: Once | ORAL | Status: AC
  Administered 2014-01-16: 2 mg via ORAL
  Filled 2014-01-16: qty 2

## 2014-01-16 MED ORDER — ACETAMINOPHEN 325 MG PO TABS: 650.0000 mg | ORAL_TABLET | Freq: Four times a day (QID) | ORAL | Status: DC | PRN

## 2014-01-16 MED ORDER — MESALAMINE 1.2 G PO TBEC
4.8000 g | DELAYED_RELEASE_TABLET | Freq: Every day | ORAL | Status: DC
  Administered 2014-01-17 – 2014-01-19 (×3): 4.8 g via ORAL
  Filled 2014-01-16 (×5): qty 4

## 2014-01-16 MED ORDER — PREGABALIN 50 MG PO CAPS
50.0000 mg | ORAL_CAPSULE | Freq: Three times a day (TID) | ORAL | Status: DC
  Filled 2014-01-16 (×3): qty 1

## 2014-01-16 MED ORDER — ALUM & MAG HYDROXIDE-SIMETH 200-200-20 MG/5ML PO SUSP: 30.0000 mL | ORAL | Status: DC | PRN

## 2014-01-16 MED ORDER — MAGNESIUM HYDROXIDE 400 MG/5ML PO SUSP
30.0000 mL | Freq: Every day | ORAL | Status: DC | PRN
  Administered 2014-01-17: 30 mL via ORAL

## 2014-01-16 NOTE — ED Notes (Signed)
Pt requesting discharge home instead of admission as recommended. Dr. Stark Jock in to talk to pt and family- Telepysch ordered for pt eval by psychiatrist by EDP- pt updated on plan of care

## 2014-01-16 NOTE — ED Notes (Signed)
Pt observed running from room down hallway during telepsych consult- security and charge nurse notified- pt walked back to room with ED staff- pt's daughter states that pt "freaked out because the psychiatrist told her she was going to a psych hospital". Pt in room and calmer at this time with sitter at bedside- Dr. Stark Jock filling out IVC paperwork per recommendation of tele-psych psychiatrist

## 2014-01-16 NOTE — Tx Team (Signed)
Initial Interdisciplinary Treatment Plan   PATIENT STRESSORS: Financial difficulties Health problems Occupational concerns   PROBLEM LIST: Problem List/Patient Goals Date to be addressed Date deferred Reason deferred Estimated date of resolution  "suicidal ideation" 01/16/2014   D/c        "anxiety" 01/16/2014   D/c        "depression" 01/16/2014   D/c                           DISCHARGE CRITERIA:  Ability to meet basic life and health needs Adequate post-discharge living arrangements Improved stabilization in mood, thinking, and/or behavior Medical problems require only outpatient monitoring Motivation to continue treatment in a less acute level of care Need for constant or close observation no longer present Reduction of life-threatening or endangering symptoms to within safe limits Safe-care adequate arrangements made Verbal commitment to aftercare and medication compliance Withdrawal symptoms are absent or subacute and managed without 24-hour nursing intervention  PRELIMINARY DISCHARGE PLAN: Attend aftercare/continuing care group Attend PHP/IOP Outpatient therapy Return to previous living arrangement  PATIENT/FAMIILY INVOLVEMENT: This treatment plan has been presented to and reviewed with the patient, Allison Greer, and patient would like treatment for her depression and anxiety.  The patient and family have been given the opportunity to ask questions and make suggestions.  [name] 01/16/2014, 7:29 PM

## 2014-01-16 NOTE — ED Notes (Signed)
Pts jewelry: earrings and 2 rings given to family.
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
## 2014-01-16 NOTE — ED Notes (Signed)
Pt to Ed co suicidal ideations. Pt has Hx of depression for which she takes antidepressants. Pt states that her meds are causing the suicidal ideations. Pt presents with flat and sad mood. denies alcohol intake or recreational drugs.dughter and brother at bedside.
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
## 2014-01-16 NOTE — ED Provider Notes (Signed)
CSN: [id number]     Arrival date & time 01/16/14  1010 History   First MD Initiated Contact with Patient 01/16/14 1030     Chief Complaint  Patient presents with  . Suicidal     (Consider location/radiation/quality/duration/timing/severity/associated sxs/prior Treatment) HPI Comments: Patient is a 53 year old female with history of anxiety and depression.  She presents today for evaluation of increased suicidal feelings.  She tells me that she took 32 mg Ativan tablets yesterday evening in an attempt to harm herself. She was brought by her daughter for psychiatric evaluation and possible admission.  Patient is a 53 y.o. female presenting with mental health disorder. The history is provided by the patient.  Mental Health Problem Presenting symptoms: suicidal thoughts   Degree of incapacity (severity):  Moderate Onset quality:  Sudden Timing:  Constant Progression:  Worsening Chronicity:  New Treatment compliance:  Untreated Relieved by:  Nothing Worsened by:  Nothing tried   Past Medical History  Diagnosis Date  . Depression   . Fibromyalgia   . Anxiety   . Crohn's disease     Was seeing [facility](Been four years)   . Arthritis, rheumatoid   . CAD (coronary artery disease)   . Anxiety    Past Surgical History  Procedure Laterality Date  . Shoulder surgery     Family History  Problem Relation Age of Onset  . Emphysema Sister     smoked  . Heart disease Father   . Heart disease Sister     2   . Heart disease Brother    History  Substance Use Topics  . Smoking status: Current Every Day Smoker -- 1.00 packs/day for 40 years    Types: Cigarettes  . Smokeless tobacco: Never Used  . Alcohol Use: No   OB History   Grav Para Term Preterm Abortions TAB SAB Ect Mult Living                 Review of Systems  Psychiatric/Behavioral: Positive for suicidal ideas.  All other systems reviewed and are negative.     Allergies  Adhesive and Morphine and  related  Home Medications   Prior to Admission medications   Medication Sig Start Date End Date Taking? Authorizing Provider  alprazolam Duanne Moron) 2 MG tablet Take 2 mg by mouth 3 (three) times daily.     Historical Provider, MD  mesalamine (LIALDA) 1.2 G EC tablet 1.2 g. Takes four tablets by mouth once daily    Historical Provider, MD  nitroGLYCERIN (NITROSTAT) 0.4 MG SL tablet Place 0.4 mg under the tongue as needed for chest pain.    Historical Provider, MD  pregabalin (LYRICA) 50 MG capsule Take 50 mg by mouth 3 (three) times daily as needed.     Historical Provider, MD   BP 113/83  Pulse 89  Temp(Src) 98.2 F (36.8 C) (Oral)  Resp 18  Ht 5\' 3"  (1.6 m)  Wt 103 lb (46.72 kg)  BMI 18.25 kg/m2  SpO2 96% Physical Exam  Nursing note and vitals reviewed. Constitutional: She is oriented to person, place, and time. She appears well-developed and well-nourished. No distress.  HENT:  Head: Normocephalic and atraumatic.  Neck: Normal range of motion. Neck supple.  Cardiovascular: Normal rate and regular rhythm.  Exam reveals no gallop and no friction rub.   No murmur heard. Pulmonary/Chest: Effort normal and breath sounds normal. No respiratory distress. She has no wheezes.  Abdominal: Soft. Bowel sounds are normal. She exhibits no distension. There  is no tenderness.  Musculoskeletal: Normal range of motion.  Neurological: She is alert and oriented to person, place, and time.  Skin: Skin is warm and dry. She is not diaphoretic.  Psychiatric: Her speech is normal. She is withdrawn. She expresses impulsivity. She exhibits a depressed mood. She expresses suicidal ideation.    ED Course  Procedures (including critical care time) Labs Review Labs Reviewed  CBC WITH DIFFERENTIAL - Abnormal; Notable for the following:    Eosinophils Relative 6 (*)    All other components within normal limits  URINALYSIS, ROUTINE W REFLEX MICROSCOPIC - Abnormal; Notable for the following:    Leukocytes,  UA TRACE (*)    All other components within normal limits  URINE MICROSCOPIC-ADD ON - Abnormal; Notable for the following:    Bacteria, UA FEW (*)    All other components within normal limits  COMPREHENSIVE METABOLIC PANEL  URINE RAPID DRUG SCREEN (HOSP PERFORMED)  ETHANOL  ACETAMINOPHEN LEVEL  SALICYLATE LEVEL    Imaging Review No results found.   EKG Interpretation None      MDM   Final diagnoses:  None    Patient brought by family for evaluation after an apparent suicide attempt.  She has a long-standing history of depression, agoraphobia, and fibromyalgia.  She took an overdose of benzodiazepines last night.  Workup today is unremarkable.    After consultation with TTS, admission for inpatient psychiatric care has been recommended.  The patient and family have requested to be discharged.  Further consultation with Telepsych has upheld my opinion that she is in need of inpatient treatment and further evaluation.  After speaking with Telepsych, the patient attempted to flee the ED.  An IVC has been initiated and the patient will be admitted to [facility].  This has been somewhat a difficult situation as the daughter and patient herself have been somewhat uncooperative.  The patient wants to leave, however I am uncomfortable with this.  The daughter also became argumentative and somewhat verbally abusive when asked not to have her cell phone in her mother's treatment area due to hospital policy.    Veryl Speak, MD 01/16/14 [phone]

## 2014-01-17 DIAGNOSIS — F411 Generalized anxiety disorder: Secondary | ICD-10-CM

## 2014-01-17 DIAGNOSIS — F1994 Other psychoactive substance use, unspecified with psychoactive substance-induced mood disorder: Secondary | ICD-10-CM

## 2014-01-17 LAB — TSH: TSH: 1.13 u[IU]/mL (ref 0.350–4.500)

## 2014-01-17 LAB — HEMOGLOBIN A1C
Hgb A1c MFr Bld: 5.2 % (ref <5.7)
Mean Plasma Glucose: 103 mg/dL (ref <117)

## 2014-01-17 LAB — LIPID PANEL
CHOLESTEROL: 171 mg/dL (ref 0–200)
HDL: 46 mg/dL (ref >39)
LDL CALC: 110 mg/dL — AB (ref 0–99)
Total CHOL/HDL Ratio: 3.7 RATIO
Triglycerides: 76 mg/dL (ref <150)
VLDL: 15 mg/dL (ref 0–40)

## 2014-01-17 MED ORDER — NICOTINE 21 MG/24HR TD PT24
21.0000 mg | MEDICATED_PATCH | Freq: Every day | TRANSDERMAL | Status: DC
  Administered 2014-01-17 – 2014-01-19 (×3): 21 mg via TRANSDERMAL
  Filled 2014-01-17 (×5): qty 1

## 2014-01-17 MED ORDER — ENSURE COMPLETE PO LIQD: 237.0000 mL | ORAL | Status: DC

## 2014-01-17 MED ORDER — CARBAMAZEPINE ER 200 MG PO TB12
200.0000 mg | ORAL_TABLET | Freq: Every day | ORAL | Status: DC
  Administered 2014-01-17 – 2014-01-19 (×3): 200 mg via ORAL
  Filled 2014-01-17 (×6): qty 1

## 2014-01-17 MED ORDER — MIRTAZAPINE 15 MG PO TABS
15.0000 mg | ORAL_TABLET | Freq: Every day | ORAL | Status: DC
  Filled 2014-01-17 (×2): qty 1

## 2014-01-17 MED ORDER — CHLORDIAZEPOXIDE HCL 25 MG PO CAPS
25.0000 mg | ORAL_CAPSULE | Freq: Four times a day (QID) | ORAL | Status: DC
  Administered 2014-01-17 – 2014-01-19 (×8): 25 mg via ORAL
  Filled 2014-01-17 (×8): qty 1

## 2014-01-17 NOTE — Progress Notes (Signed)
D   Pt took her medications late because she was asleep and wouldn't respond when her name was called   Pt came later but refused prescribed medications because she said they dont agree with her and she rages sometimes when she takes them   Pt requested xanax but it was explained since she overdosed on same she would probably not get that medication   She did request a cup of gingerale A   Verbal support given   Medications educated on and offered   Q 15 min checks R   Pt safe at present and verbalized understanding

## 2014-01-17 NOTE — Progress Notes (Signed)
D: Pt presents with flat affect, depressed mood, minimal interaction on the unit and poor insight for treatment. Pt requesting to be discharged. Pt feeling remorseful for attempting to overdose on pills. Pt anxious this morning, fidgety and pacing. Pt refused to take Lyrica this morning and afternoon.  A: Medications administered as ordered per MD. Verbal support given. Pt encouraged to attend groups. 15 minute checks performed for safety.  R: Pt safety maintained at this time.
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
## 2014-01-17 NOTE — Progress Notes (Signed)
NUTRITION ASSESSMENT  Pt identified as at risk on the Malnutrition Screen Tool  INTERVENTION: 1. Educated patient on the importance of nutrition and encouraged intake of food and beverages. 2. Discussed weight goals. 3. Supplements: Ensure Complete po once daily, each supplement provides 350 kcal and 13 grams of protein   NUTRITION DIAGNOSIS: Unintentional weight loss related to sub-optimal intake as evidenced by pt report.   Goal: Pt to meet >/= 90% of their estimated nutrition needs.  Monitor:  PO intake  Assessment:  Patient admitted with SA, borderline personality disorder.   Patient reports hx of chron's disease with occasional flair ups.  Can not drink milk, greasy foods, or foods with a lot of fiber.  States that her appetite and intake currently and prior to admit are fair.  Often eats only 2 meals per day and 1-2 snacks secondary to decreased appetite.  Weight overall has been stable.    53 y.o. female  Height: Ht Readings from Last 1 Encounters:  01/16/14 5\' 2"  (1.575 m)    Weight: Wt Readings from Last 1 Encounters:  01/16/14 104 lb 12 oz (47.514 kg)    Weight Hx: Wt Readings from Last 10 Encounters:  01/16/14 104 lb 12 oz (47.514 kg)  01/16/14 103 lb (46.72 kg)  10/24/13 107 lb (48.535 kg)  05/22/13 105 lb (47.628 kg)    BMI:  Body mass index is 19.15 kg/(m^2). Pt meets criteria for normal weight based on current BMI.  Estimated Nutritional Needs: Kcal: 25-30 kcal/kg Protein: > 1 gram protein/kg Fluid: 1 ml/kcal  Diet Order: General Pt is also offered choice of unit snacks mid-morning and mid-afternoon.  Pt is eating as desired.   Lab results and medications reviewed.   Antonieta Iba, RD, LDN Clinical Inpatient Dietitian Pager:  [phone] Weekend and after hours pager:  [phone]

## 2014-01-17 NOTE — BHH Group Notes (Signed)
[facility] LCSW Group Therapy  Emotional [facility] 1:15 - 2: 30 PM        01/17/2014  2:42 PM    Type of Therapy:  Group Therapy  Participation Level:  Patient did not attend group - in bed.   Concha Pyo 01/17/2014 2:42 PM

## 2014-01-18 DIAGNOSIS — F410 Panic disorder [episodic paroxysmal anxiety] without agoraphobia: Secondary | ICD-10-CM

## 2014-01-18 DIAGNOSIS — F411 Generalized anxiety disorder: Secondary | ICD-10-CM | POA: Diagnosis present

## 2014-01-18 NOTE — Progress Notes (Signed)
[facility] Group Notes:  (Nursing/MHT/Case Management/Adjunct)  Date:  01/18/2014  Time:  2100  Type of Therapy:  wrap up group  Participation Level:  Active  Participation Quality:  Appropriate, Attentive and Sharing  Affect:  Anxious and Flat  Cognitive:  Appropriate  Insight:  Improving  Engagement in Group:  Engaged  Modes of Intervention:  Clarification, Education and Support  Summary of Progress/Problems: Pt reported the medicine helping her to come off of her xanax.  Pt reports being ready to discharge tomorrow. Pt shared she was in a dark place and wants to socialize and get out more. Pt shared that her anxiety got so bad that she was only going to the store, 1 mile away from her home, once a week. Pt is also planning on seeing a psychologist or psychiatrist twice a month.   Allison Greer 01/18/2014, 10:28 PM

## 2014-01-18 NOTE — BHH Group Notes (Signed)
[facility] L[facility] Group Therapy  [facility] 1:15 - 2:30 PM  01/18/2014   Type of Therapy:  Group Therapy  Participation Level: Active  Participation Quality:  Attentive  Affect:  Appropriate  Cognitive:  Appropriate  Insight:  Developing/Improving   Engagement in Therapy:  Developing/Improving   Modes of Intervention:  Discussion, Education, Exploration, Problem-Solving, Rapport Building, Support   Summary of Progress/Problems:   Patient was attentive to speaker from the [facility] as he shared his story of dealing with mental health/substance abuse issues and overcoming it by working a recovery program.  Patient expressed interest in their programs and services and received information on their agency.  She thanked the speaker for his presentation.  She shared she will always remember that "d" added to anger leads to "danger."  Concha Pyo 01/18/2014

## 2014-01-18 NOTE — Progress Notes (Signed)
Patient ID: Allison Greer, female   DOB: [birth year], 53 y.o.   MRN: [id number] D: Patient in room on approach. Pt stated her goal is to accept her present situation and not to feel guilty for not been able to a lot. Pt mood/affect depressed and sad. Pt denies SI/HI/AVH and pain. Pt denies any needs or concerns.  Cooperative with assessment. No acute distressed noted at this time.   A: Met with pt 1:1. Medications administered as prescribed. Writer encouraged pt to discuss feelings. Pt encouraged to come to staff with any question or concerns.   R: Patient remains safe. She is complaint with medications and denies any adverse reaction. Continue current POC.

## 2014-01-18 NOTE — Plan of Care (Signed)
Problem: Diagnosis: Increased Risk For Suicide Attempt Goal: STG-Patient Will Comply With Medication Regime Outcome: Progressing Pt compliant with medication

## 2014-01-18 NOTE — Progress Notes (Signed)
Pt has been up and active in the milieu today. She rated her depression and anxiety both a 4 and denied any hopelessness on her self-inventory. She denied any S/H ideation or A/V/H. She plans to go to her PCP to let him find her a therapist and psychiatrist.

## 2014-01-18 NOTE — Progress Notes (Signed)
[facility] MD Progress Note  01/18/2014 1:54 PM Allison Greer  MRN:  [id number] Subjective:  Admits she feels more clear headed right now. She continues to affirm that all the antidepressants including Remeron have caused agitation so she did not take it last night. She has tolerated the Librium 25 mg up to four times a day well. States that the Xanax she had to be taking every 2 hours. She states she was wanting to get off it and she welcomes this plan. She is willing to continue to take the Tegretol knowing that it is a mood stabilizer and not an antidepressant. She understands that she will have to be weaned the Librium slowly ( concerned about going to fast given that she has been on Xanax up to 6 mg for years. She states she will like to be on the least amount of medications as possible. She recognizes she needs to see a therapist to start dealing with all her losses: family members, dog, her physical health Diagnosis:   DSM5: Depressive Disorders:  Major Depressive Disorder - Severe (296.23) Total Time spent with patient: 30 minutes  Axis I: Panic Disorder  ADL's:  Intact  Sleep: Fair  Appetite:  Fair   Psychiatric Specialty Exam: Physical Exam  Review of Systems  HENT: Negative.   Eyes: Negative.   Respiratory: Negative.   Cardiovascular: Negative.   Gastrointestinal: Negative.   Genitourinary: Negative.   Musculoskeletal: Positive for joint pain and myalgias.  Skin: Negative.   Neurological: Positive for weakness.  Endo/Heme/Allergies: Negative.   Psychiatric/Behavioral: Positive for depression and substance abuse. The patient is nervous/anxious.     Blood pressure 137/91, pulse 89, temperature 98.1 F (36.7 C), temperature source Oral, resp. rate 16, height 5\' 2"  (1.575 m), weight 47.514 kg (104 lb 12 oz), last menstrual period 04/14/2007.Body mass index is 19.15 kg/(m^2).  General Appearance: Fairly Groomed  Engineer, water::  Fair  Speech:  Clear and Coherent  Volume:  Decreased   Mood:  Anxious and sad, worried  Affect:  Appropriate  Thought Process:  Coherent and Goal Directed  Orientation:  Full (Time, Place, and Person)  Thought Content:  events symptoms worries concerns  Suicidal Thoughts:  No  Homicidal Thoughts:  No  Memory:  Immediate;   Fair Recent;   Fair Remote;   Fair  Judgement:  Fair  Insight:  Present  Psychomotor Activity:  Restlessness  Concentration:  Fair  Recall:  [employer] of Knowledge:NA  Language: Fair  Akathisia:  No  Handed:    AIMS (if indicated):     Assets:  Desire for Improvement  Sleep:  Number of Hours: 2.5   Musculoskeletal: Strength & Muscle Tone: within normal limits Gait & Station: normal Patient leans: N/A  Current Medications: Current Facility-Administered Medications  Medication Dose Route Frequency Provider Last Rate Last Dose  . acetaminophen (TYLENOL) tablet 650 mg  650 mg Oral Q6H PRN Laverle Hobby, PA-C      . alum & mag hydroxide-simeth (MAALOX/MYLANTA) 200-200-20 MG/5ML suspension 30 mL  30 mL Oral Q4H PRN Laverle Hobby, PA-C      . carbamazepine (TEGRETOL XR) 12 hr tablet 200 mg  200 mg Oral Daily Nicholaus Bloom, MD   200 mg at 01/18/14 7829  . chlordiazePOXIDE (LIBRIUM) capsule 25 mg  25 mg Oral QID Nicholaus Bloom, MD   25 mg at 01/18/14 1132  . feeding supplement (ENSURE COMPLETE) (ENSURE COMPLETE) liquid 237 mL  237 mL Oral Q24H  Darrol Jump, RD      . hydrOXYzine (ATARAX/VISTARIL) tablet 25 mg  25 mg Oral Q6H PRN Laverle Hobby, PA-C   25 mg at 01/17/14 0867  . magnesium hydroxide (MILK OF MAGNESIA) suspension 30 mL  30 mL Oral Daily PRN Laverle Hobby, PA-C   30 mL at 01/17/14 2005  . mesalamine (LIALDA) EC tablet 4.8 g  4.8 g Oral Q breakfast Laverle Hobby, PA-C   4.8 g at 01/18/14 6195  . nicotine (NICODERM CQ - dosed in mg/24 hours) patch 21 mg  21 mg Transdermal Daily Nicholaus Bloom, MD   21 mg at 01/18/14 0825  . nitroGLYCERIN (NITROSTAT) SL tablet 0.4 mg  0.4 mg Sublingual PRN Laverle Hobby, PA-C      . pregabalin (LYRICA) capsule 50 mg  50 mg Oral TID Laverle Hobby, PA-C        Lab Results:  Results for orders placed during the hospital encounter of 01/16/14 (from the past 48 hour(s))  TSH     Status: None   Collection Time    01/17/14  6:25 AM      Result Value Ref Range   TSH 1.130  0.350 - 4.500 uIU/mL   Comment: Performed at [place] [facility]     Status: None   Collection Time    01/17/14  6:25 AM      Result Value Ref Range   Hemoglobin A1C 5.2  <5.7 %   Comment: (NOTE)                                                                               According to the ADA Clinical Practice Recommendations for 2011, when     HbA1c is used as a screening test:      >=6.5%   Diagnostic of Diabetes Mellitus               (if abnormal result is confirmed)     5.7-6.4%   Increased risk of developing Diabetes Mellitus     References:Diagnosis and Classification of Diabetes Mellitus,Diabetes     KDTO,6712,45(YKDXI 1):S62-S69 and Standards of Medical Care in             Diabetes - 2011,Diabetes Care,2011,34 (Suppl 1):S11-S61.   Mean Plasma Glucose 103  <117 mg/dL   Comment: Performed at [place]     Status: Abnormal   Collection Time    01/17/14  6:25 AM      Result Value Ref Range   Cholesterol 171  0 - 200 mg/dL   Triglycerides 76  <150 mg/dL   HDL 46  >39 mg/dL   Total CHOL/HDL Ratio 3.7     VLDL 15  0 - 40 mg/dL   LDL Cholesterol 110 (*) 0 - 99 mg/dL   Comment:            Total Cholesterol/HDL:CHD Risk     Coronary Heart Disease Risk Table                         Men   Women  1/2 Average Risk   3.4   3.3      Average Risk       5.0   4.4      2 X Average Risk   9.6   7.1      3 X Average Risk  23.4   11.0                Use the calculated Patient Ratio     above and the CHD Risk Table     to determine the patient's CHD Risk.                ATP III CLASSIFICATION (LDL):      <100     mg/dL   Optimal       100-129  mg/dL   Near or Above                        Optimal      130-159  mg/dL   Borderline      160-189  mg/dL   High      >190     mg/dL   Very High     Performed at [facility]    Physical Findings: AIMS: Facial and Oral Movements Muscles of Facial Expression: None, normal Lips and Perioral Area: None, normal Jaw: None, normal Tongue: None, normal,Extremity Movements Upper (arms, wrists, hands, fingers): None, normal Lower (legs, knees, ankles, toes): None, normal, Trunk Movements Neck, shoulders, hips: None, normal, Overall Severity Severity of abnormal movements (highest score from questions above): None, normal Incapacitation due to abnormal movements: None, normal Patient's awareness of abnormal movements (rate only patient's report): No Awareness, Dental Status Current problems with teeth and/or dentures?: No Does patient usually wear dentures?: No  CIWA:  CIWA-Ar Total: 2 COWS:  COWS Total Score: 3  Treatment Plan Summary: Daily contact with patient to assess and evaluate symptoms and progress in treatment Medication management  Plan: Supportive approach/coping skills/CBt, mindfulness           Continue Librium 25 mg QID                             Tegretol XR 200 mg BID           Develop a plan to guide weaning off the Librium on an outpatient basis             Medical Decision Making Problem Points:  Review of psycho-social stressors (1) Data Points:  Review of medication regiment & side effects (2) Review of new medications or change in dosage (2)  I certify that inpatient services furnished can reasonably be expected to improve the patient's condition.   Kaylyn Garrow A 01/18/2014, 1:54 PM

## 2014-01-18 NOTE — Plan of Care (Signed)
Problem: Diagnosis: Increased Risk For Suicide Attempt Goal: STG-Patient Will Attend All Groups On The Unit Outcome: Completed/Met Date Met:  01/18/14 Pt did not attend evening NA group

## 2014-01-18 NOTE — BHH Group Notes (Signed)
0900 nursing orientation group   The focus of this group is to educate the patient on the purpose and policies of crisis stabilization and provide a format to answer questions about their admission.  The group details unit policies and expectations of patients while admitted.  Pt was an active participant and was appropriate in sharing with the group.

## 2014-01-18 NOTE — BHH Suicide Risk Assessment (Signed)
[place] INPATIENT:  Family/Significant Other Suicide Prevention Education  Suicide Prevention Education:  Education Completed; Allison Greer, Daughter, [phone];  has been identified by the patient as the family member/significant other with whom the patient will be residing, and identified as the person(s) who will aid the patient in the event of a mental health crisis (suicidal ideations/suicide attempt).  With written consent from the patient, the family member/significant other has been provided the following suicide prevention education, prior to the and/or following the discharge of the patient.  The suicide prevention education provided includes the following:  Suicide risk factors  Suicide prevention and interventions  National Suicide Hotline telephone number  [facility] assessment telephone number  [facility] Emergency Assistance [place] and/or Residential Mobile Crisis Unit telephone number  Request made of family/significant other to:  Remove weapons (e.g., guns, rifles, knives), all items previously/currently identified as safety concern.  Daughter  advised guns will be secured prior to patient discharging.    Remove drugs/medications (over-the-counter, prescriptions, illicit drugs), all items previously/currently identified as a safety concern.  The family member/significant other verbalizes understanding of the suicide prevention education information provided.  The family member/significant other agrees to remove the items of safety concern listed above.  Concha Pyo 01/18/2014, 12:44 PM

## 2014-01-19 DIAGNOSIS — F332 Major depressive disorder, recurrent severe without psychotic features: Principal | ICD-10-CM

## 2014-01-19 MED ORDER — PREGABALIN 50 MG PO CAPS: 50.0000 mg | ORAL_CAPSULE | Freq: Three times a day (TID) | ORAL | Status: AC

## 2014-01-19 MED ORDER — CHLORDIAZEPOXIDE HCL 25 MG PO CAPS: 25.0000 mg | ORAL_CAPSULE | Freq: Four times a day (QID) | ORAL | Status: DC

## 2014-01-19 MED ORDER — CARBAMAZEPINE ER 200 MG PO TB12: 200.0000 mg | ORAL_TABLET | Freq: Two times a day (BID) | ORAL | Status: AC

## 2014-01-19 MED ORDER — MESALAMINE 1.2 G PO TBEC: 4.8000 g | DELAYED_RELEASE_TABLET | Freq: Every day | ORAL | Status: AC

## 2014-01-19 MED ORDER — HYDROXYZINE HCL 25 MG PO TABS: 25.0000 mg | ORAL_TABLET | Freq: Four times a day (QID) | ORAL | Status: AC | PRN

## 2014-01-19 MED ORDER — CHLORDIAZEPOXIDE HCL 25 MG PO CAPS: 25.0000 mg | ORAL_CAPSULE | Freq: Four times a day (QID) | ORAL | Status: AC

## 2014-01-19 MED ORDER — CARBAMAZEPINE ER 200 MG PO TB12: 200.0000 mg | ORAL_TABLET | Freq: Every day | ORAL | Status: DC

## 2014-01-19 NOTE — Progress Notes (Signed)
D   Pt is brighter this evening and is making positive comments about going home on Friday   She denies suicidal ideation   She is active on the milieu and cooperative with treatment A   Verbal support given   Medications administered and effectiveness monitored   Q 15 min checks R   Pt safe at present

## 2014-01-19 NOTE — Progress Notes (Signed)
D) Pt being discharged to home accompanied by her daughter. Affect and mood are appropriate. Pt denies SI and HI. Rates her depression at a 3, her hopelessness at a 2 and her anxiety at a 4. Has refused her Lyrica twice today, at 0800 and again at 1200. States "that is addicting and if I don't need it I don't want to use it".  A) All belongings returned to Pt. Provided Pt with a 1:1 and all questions were answered. Pt is aware of her follow up plans and the medications she should be taking. All medications and follow up plans explained. R) Pt denies SI and HI.

## 2014-01-19 NOTE — Tx Team (Signed)
Interdisciplinary Treatment Plan Update   Date Reviewed:  01/19/2014  Time Reviewed:  10:53 AM  Progress in Treatment:   Attending groups: Yes Participating in groups: Yes Taking medication as prescribed: Yes  Tolerating medication: Yes Family/Significant other contact made: Yes, collateral contact with daughter. Patient understands diagnosis:  Yes, patient understands diagnosis and need for treatment. Discussing patient identified problems/goals with staff: Yes, patient is able to express goals for treatment and discharge. Medical problems stabilized or resolved: Yes Denies suicidal/homicidal ideation: Yes Patient has not harmed self or others: Yes  For review of initial/current patient goals, please see plan of care.  Estimated Length of Stay:  discharge today  Reasons for Continued Hospitalization:   New Problems/Goals identified:    Discharge Plan or Barriers:   Home with outpatient follow up scheduled with [facility]  Additional Comments:  Patient advised she would prefer to follow up with [facility] but no funding source to pay for treatment at this time.  Patient and CSW reviewed patient's identified goals and treatment plan.  Patient verbalized understanding and agreed to treatment plan.   Attendees:  Patient:  01/19/2014 10:53 AM   Signature:   01/19/2014 10:53 AM  Signature: Carlton Adam, MD 01/19/2014 10:53 AM  Signature:   Talbert Cage, RN  01/19/2014 10:53 AM  Signature:  Jilda Roche, RN 01/19/2014 10:53 AM  Signature:   01/19/2014 10:53 AM  Signature:  Joette Catching, LCSW 01/19/2014 10:53 AM  Signature:  Erasmo Downer Drinkard, LCSW-A 01/19/2014 10:53 AM  Signature:  Myles Gip, Care Coordinator [facility] 01/19/2014 10:53 AM  Signature:   01/19/2014 10:53 AM  Signature: 01/19/2014  10:53 AM  Signature:   Lars Pinks, RN [facility] 01/19/2014  10:53 AM  Signature:  [place] Worker LCSW 01/19/2014  10:53 AM    Scribe for Treatment Team:   [employer],   01/19/2014 10:53 AM

## 2014-01-19 NOTE — Discharge Summary (Signed)
Physician Discharge Summary Note  Patient:  Allison Greer is an 53 y.o., female MRN:  [id number] DOB:  22-Mar-1961 Patient phone:  [phone] (home)  Patient address:   [address] [place] [address],  Total Time spent with patient: 45 minutes  Date of Admission:  01/16/2014 Date of Discharge: 01/19/2014  Reason for Admission:   Will see Dr. Maceo Pro in 2 weeks. He will start weaning off the Librium  Is patient on multiple antipsychotic therapies at discharge: No  Has Patient had three or more failed trials of antipsychotic monotherapy by history: No  Discharge Diagnoses: Active Problems:   MDD (major depressive disorder), recurrent episode, severe   GAD (generalized anxiety disorder)   Psychiatric Specialty Exam: Physical Exam  Psychiatric: She has a normal mood and affect. Her speech is normal and behavior is normal. Judgment and thought content normal. Cognition and memory are normal.    Review of Systems  Psychiatric/Behavioral: Positive for depression (Hx of, chronic, stabilized). Negative for suicidal ideas (Hx of, chronic, stabilized), hallucinations, memory loss and substance abuse. The patient is not nervous/anxious and does not have insomnia.     Blood pressure 132/80, pulse 77, temperature 99.4 F (37.4 C), temperature source Oral, resp. rate 20, height 5\' 2"  (1.575 m), weight 47.514 kg (104 lb 12 oz), last menstrual period 04/14/2007.Body mass index is 19.15 kg/(m^2).   Past Psychiatric History:  Diagnosis: Depression  Hospitalizations: [facility] in the last 5 years   Outpatient Care: Not currently Medications managed by Dr. Maceo Pro   Substance Abuse Care: Denies   Self-Mutilation: Denies   Suicidal Attempts: Took an OD before she went to [facility]   Violent Behaviors: Towards herself, when they gave her antidepressants    Musculoskeletal: Strength & Muscle Tone: within normal limits Gait & Station: normal Patient leans:  N/A  DSM5:  Schizophrenia Disorders:  NA Obsessive-Compulsive Disorders:  NA Trauma-Stressor Disorders:  NA Substance/Addictive Disorders:  NA Depressive Disorders:  Major Depressive Disorder - Severe (296.23)  Axis Diagnosis:   AXIS I:  Major Depression, Recurrent severe AXIS II:  Deferred AXIS III:   Past Medical History  Diagnosis Date  . Depression   . Fibromyalgia   . Anxiety   . Crohn's disease     Was seeing [facility](Been four years)   . Arthritis, rheumatoid   . CAD (coronary artery disease)   . Anxiety    AXIS IV:  economic problems and other psychosocial or environmental problems AXIS V:  61-70 mild symptoms  Level of Care:  OP  Hospital Course:   Allison Greer is a 53 yo female who came in stating she overdosed on Xanax after arguing with her daughter and induced herself to vomit.  She reports suddenly getting overwhelmed with taking care of a cat, several deaths in her life, and living with depression for 20 years.  She stated being on Prozac until it became ineffective.  Furthermore, whenever, she was started on a new antidepressant, she would experience sudden rages.    After careful review of patients history and presentation, it was determined that Allison Greer would need medication management to stabilize her moods.  She was ordered for and administered Tegretol 200 mg 25 mg for mood stabilization.  She will continue on Librium post discharge and will be weaned off on an outpatient follow up with  [facility] - Dr. Mont Dutton.  Group therapy through supportive approach focused on coping skills and mindfulness.  CSW helped to facilitate outpatient  treatment at [facility].  Patient was encouraged to make appointments that were set up to help maintain mental health and wellness.  Consults:  psychiatry  Significant Diagnostic Studies:  labs: Per ED  Discharge Vitals:   Blood pressure 132/80, pulse 77, temperature 99.4 F (37.4 C),  temperature source Oral, resp. rate 20, height 5\' 2"  (1.575 m), weight 47.514 kg (104 lb 12 oz), last menstrual period 04/14/2007. Body mass index is 19.15 kg/(m^2). Lab Results:   Results for orders placed during the hospital encounter of 01/16/14 (from the past 72 hour(s))  TSH     Status: None   Collection Time    01/17/14  6:25 AM      Result Value Ref Range   TSH 1.130  0.350 - 4.500 uIU/mL   Comment: Performed at [place] A1C     Status: None   Collection Time    01/17/14  6:25 AM      Result Value Ref Range   Hemoglobin A1C 5.2  <5.7 %   Comment: (NOTE)                                                                               According to the ADA Clinical Practice Recommendations for 2011, when     HbA1c is used as a screening test:      >=6.5%   Diagnostic of Diabetes Mellitus               (if abnormal result is confirmed)     5.7-6.4%   Increased risk of developing Diabetes Mellitus     References:Diagnosis and Classification of Diabetes Mellitus,Diabetes     GGYI,9485,46(EVOJJ 1):S62-S69 and Standards of Medical Care in             Diabetes - 2011,Diabetes Care,2011,34 (Suppl 1):S11-S61.   Mean Plasma Glucose 103  <117 mg/dL   Comment: Performed at [place]     Status: Abnormal   Collection Time    01/17/14  6:25 AM      Result Value Ref Range   Cholesterol 171  0 - 200 mg/dL   Triglycerides 76  <150 mg/dL   HDL 46  >39 mg/dL   Total CHOL/HDL Ratio 3.7     VLDL 15  0 - 40 mg/dL   LDL Cholesterol 110 (*) 0 - 99 mg/dL   Comment:            Total Cholesterol/HDL:CHD Risk     Coronary Heart Disease Risk Table                         Men   Women      1/2 Average Risk   3.4   3.3      Average Risk       5.0   4.4      2 X Average Risk   9.6   7.1      3 X Average Risk  23.4   11.0                Use the calculated Patient Ratio  above and the CHD Risk Table     to determine the patient's CHD Risk.                 ATP III CLASSIFICATION (LDL):      <100     mg/dL   Optimal      100-129  mg/dL   Near or Above                        Optimal      130-159  mg/dL   Borderline      160-189  mg/dL   High      >190     mg/dL   Very High     Performed at [facility]    Physical Findings: AIMS: Facial and Oral Movements Muscles of Facial Expression: None, normal Lips and Perioral Area: None, normal Jaw: None, normal Tongue: None, normal,Extremity Movements Upper (arms, wrists, hands, fingers): None, normal Lower (legs, knees, ankles, toes): None, normal, Trunk Movements Neck, shoulders, hips: None, normal, Overall Severity Severity of abnormal movements (highest score from questions above): None, normal Incapacitation due to abnormal movements: None, normal Patient's awareness of abnormal movements (rate only patient's report): No Awareness, Dental Status Current problems with teeth and/or dentures?: No Does patient usually wear dentures?: No  CIWA:  CIWA-Ar Total: 2 COWS:  COWS Total Score: 3  Psychiatric Specialty Exam: See Psychiatric Specialty Exam and Suicide Risk Assessment completed by Attending Physician prior to discharge.  Discharge destination:  Home  Is patient on multiple antipsychotic therapies at discharge:  No   Has Patient had three or more failed trials of antipsychotic monotherapy by history:  No  Recommended Plan for Multiple Antipsychotic Therapies: NA     Medication List    STOP taking these medications       alprazolam 2 MG tablet  Commonly known as:  XANAX     nitroGLYCERIN 0.4 MG SL tablet  Commonly known as:  NITROSTAT      TAKE these medications     Indication   carbamazepine 200 MG 12 hr tablet  Commonly known as:  TEGRETOL XR  Take 1 tablet (200 mg total) by mouth 2 (two) times daily. For mood stabilization   Indication:  Mood Stabilization     chlordiazePOXIDE 25 MG capsule  Commonly known as:  LIBRIUM  Take 1 capsule (25 mg total) by  mouth 4 (four) times daily. For alcohol dependence.  This Rx is for 14 days only   Indication:  Acute Alcohol Withdrawal Syndrome     hydrOXYzine 25 MG tablet  Commonly known as:  ATARAX/VISTARIL  Take 1 tablet (25 mg total) by mouth every 6 (six) hours as needed for anxiety. For anxiety   Indication:  Anxiety Neurosis, Tension     mesalamine 1.2 G EC tablet  Commonly known as:  LIALDA  Take 4 tablets (4.8 g total) by mouth daily with breakfast. For crohn's disease   Indication:  Crohn's Disease     pregabalin 50 MG capsule  Commonly known as:  LYRICA  Take 1 capsule (50 mg total) by mouth 3 (three) times daily. For fibromyalgia pain   Indication:  Fibromyalgia Syndrome           Follow-up Information   Follow up with [facility] On 01/22/2014. (You are scheduled with [facility] on Monday, January 22, 2014 at [facility])    Contact information:   [address] [facility] [address] [facility],  [place]   [address]  [phone]      Follow up with [facility] - Dr. Mont Dutton On 01/19/2014. (Patient to schedule follow up with PCP)    Contact information:   [place],    [address]  [phone]      Follow-up recommendations:  Activity:  As tolerated Diet:  As tolerated  Comments:  1.  Take all your medications as prescribed.              2.  Report any adverse side effects to outpatient provider.                       3.  Patient instructed to not use alcohol or illegal drugs while on prescription medicines.            4.  In the event of worsening symptoms, instructed patient to call 911, the crisis hotline or go to nearest emergency room for evaluation of symptoms.  Total Discharge Time:  Greater than 30 minutes.  SignedKerrie Buffalo MAY, AGNP-BC 01/19/2014, 2:15 PM I personally assessed the patient and formulated the plan Geralyn Flash A. Sabra Heck, M.D.

## 2014-01-19 NOTE — Progress Notes (Signed)
[facility] Adult Case Management Discharge Plan :  Will you be returning to the same living situation after discharge: Yes,  Patient is returning home with family. At discharge, do you have transportation home?:Yes,  Patient to arrange transportation home. Do you have the ability to pay for your medications:No. Patient needs assistance with indigent medications   Release of information consent forms completed and in the chart;  Patient's signature needed at discharge.  Patient to Follow up at: Follow-up Information   Follow up with [facility] On 01/22/2014. (You are scheduled with [facility] on Monday, January 22, 2014 at [facility])    Contact information:   [address], [place]   [address]  [phone]      Patient denies SI/HI:  Patient no longer endorsing SI/HI or other thoughts of self harm.   Safety Planning and Suicide Prevention discussed: .Reviewed with all patients during discharge planning group   Madalene Mickler, [place] Post 01/19/2014, 10:49 AM

## 2014-01-19 NOTE — [facility] Suicide Risk Assessment (Signed)
Suicide Risk Assessment  Discharge Assessment     Demographic Factors:  Caucasian  Total Time spent with patient: 30 minutes  Psychiatric Specialty Exam:     Blood pressure 132/80, pulse 77, temperature 99.4 F (37.4 C), temperature source Oral, resp. rate 20, height 5\' 2"  (1.575 m), weight 47.514 kg (104 lb 12 oz), last menstrual period 04/14/2007.Body mass index is 19.15 kg/(m^2).  General Appearance: Fairly Groomed  Engineer, water::  Fair  Speech:  Clear and Coherent  Volume:  Normal  Mood:  Anxious  Affect:  Appropriate  Thought Process:  Coherent and Goal Directed  Orientation:  Full (Time, Place, and Person)  Thought Content:  plans as she moves on  Suicidal Thoughts:  No  Homicidal Thoughts:  No  Memory:  Immediate;   Fair Recent;   Fair Remote;   Fair  Judgement:  Fair  Insight:  Present  Psychomotor Activity:  Normal  Concentration:  Fair  Recall:  [employer] of Knowledge:NA  Language: Fair  Akathisia:  No  Handed:    AIMS (if indicated):     Assets:  Desire for Improvement Housing Social Support  Sleep:  Number of Hours: 4.5    Musculoskeletal: Strength & Muscle Tone: within normal limits Gait & Station: normal Patient leans: N/A   Mental Status Per Nursing Assessment::   On Admission:     Current Mental Status by Physician: IN full contact with reality. There are no active S/S of withdrawal. No SI plans or intent. Mood worried but hopeful. Looking forward to being off the Xanax   Loss Factors: Decline in physical health  Historical Factors: NA  Risk Reduction Factors:   Sense of responsibility to family, Living with another person, especially a relative and Positive social support  Continued Clinical Symptoms:  Depression:   Impulsivity  Cognitive Features That Contribute To Risk:  Polarized thinking Thought constriction (tunnel vision)    Suicide Risk:  Minimal: No identifiable suicidal ideation.  Patients presenting with no risk  factors but with morbid ruminations; may be classified as minimal risk based on the severity of the depressive symptoms  Discharge Diagnoses:   AXIS I:  Major Depression recurrent severe, GAD, Panic Attacks, OCD, Mood Disorder AXIS II:  No diagnosis AXIS III:   Past Medical History  Diagnosis Date  . Depression   . Fibromyalgia   . Anxiety   . Crohn's disease     Was seeing [facility](Been four years)   . Arthritis, rheumatoid   . CAD (coronary artery disease)   . Anxiety    AXIS IV:  other psychosocial or environmental problems AXIS V:  61-70 mild symptoms  Plan Of Care/Follow-up recommendations:  Activity:  as tolerated Diet:  regular Will see Dr. Maceo Pro in 2 weeks. He will start weaning off the Librium Is patient on multiple antipsychotic therapies at discharge:  No   Has Patient had three or more failed trials of antipsychotic monotherapy by history:  No  Recommended Plan for Multiple Antipsychotic Therapies: NA    Darinda [place] A 01/19/2014, 1:39 PM

## 2014-01-22 MED ORDER — CARBAMAZEPINE ER 200 MG PO TB12
200.0000 mg | ORAL_TABLET | Freq: Two times a day (BID) | ORAL | Status: DC
  Filled 2014-01-22: qty 14

## 2014-01-22 NOTE — Consult Note (Signed)
Case discussed, agree with plan 

## 2014-01-23 NOTE — Progress Notes (Signed)
Patient Discharge Instructions:  After Visit Summary (AVS):   Faxed to:  01/23/14 Discharge Summary Note:   Faxed to:  01/23/14 Psychiatric Admission Assessment Note:   Faxed to:  01/23/14 Suicide Risk Assessment - Discharge Assessment:   Faxed to:  01/23/14 Faxed/Sent to the Next [facility] Care provider:  01/23/14 Faxed to [facility] @ [phone] Faxed to [facility] @ [phone]  Patsey Berthold, 01/23/2014, 2:47 PM

## 2014-03-23 ENCOUNTER — Other Ambulatory Visit: Payer: Self-pay | Admitting: Internal Medicine

## 2014-03-23 DIAGNOSIS — R911 Solitary pulmonary nodule: Secondary | ICD-10-CM

## 2014-05-01 ENCOUNTER — Ambulatory Visit (INDEPENDENT_AMBULATORY_CARE_PROVIDER_SITE_OTHER): Payer: 59

## 2014-05-01 DIAGNOSIS — Z09 Encounter for follow-up examination after completed treatment for conditions other than malignant neoplasm: Secondary | ICD-10-CM

## 2014-05-01 DIAGNOSIS — I700 Atherosclerosis of aorta: Secondary | ICD-10-CM

## 2014-05-01 DIAGNOSIS — R911 Solitary pulmonary nodule: Secondary | ICD-10-CM

## 2014-05-01 NOTE — Progress Notes (Signed)
Quick Note:  Spoke with pt and notified of results per Dr. Wert. Pt verbalized understanding and denied any questions.  ______ 

## 2014-05-02 ENCOUNTER — Other Ambulatory Visit: Payer: Self-pay

## 2015-04-10 ENCOUNTER — Telehealth: Payer: Self-pay | Admitting: *Deleted

## 2015-04-10 DIAGNOSIS — R911 Solitary pulmonary nodule: Secondary | ICD-10-CM

## 2015-04-10 NOTE — Telephone Encounter (Signed)
-----   Message from Tanda Rockers, MD sent at 05/01/2014  3:06 PM EST ----- Ct s contrast f/u mpns

## 2015-04-10 NOTE — Telephone Encounter (Signed)
CT has been order for Jan 2017

## 2015-04-30 ENCOUNTER — Other Ambulatory Visit: Payer: Self-pay

## 2015-05-13 ENCOUNTER — Other Ambulatory Visit: Payer: Self-pay

## 2015-05-24 ENCOUNTER — Telehealth: Payer: Self-pay | Admitting: Internal Medicine

## 2015-05-24 NOTE — Telephone Encounter (Signed)
Called [employer] extended precert [id number] auth# [id number]

## 2015-05-24 NOTE — Telephone Encounter (Signed)
Spoke with Jeannene Patella, states that pt is having a CT chest wo contrast on Monday, but her authorization has expired- needs our office to call and get an extension on the ct authorization through "[employer]".  I asked for contact information in order to do this- was told that the pcc's should know how to do this.  PCC"s please advise on this.  Thanks!

## 2015-05-27 ENCOUNTER — Ambulatory Visit (INDEPENDENT_AMBULATORY_CARE_PROVIDER_SITE_OTHER): Payer: Medicaid Other

## 2015-05-27 DIAGNOSIS — R911 Solitary pulmonary nodule: Secondary | ICD-10-CM | POA: Diagnosis not present

## 2016-10-15 IMAGING — CT CT CHEST W/O CM
2 of 3 series · 15 of 36 positions shown, 18 images · non-contrast
Comparison: 05/01/2014.  05/04/2013.

CLINICAL DATA: Pulmonary nodules

EXAM:
CT CHEST WITHOUT CONTRAST
TECHNIQUE: Multidetector CT imaging of the chest was performed following the
standard protocol without IV contrast.

[Series 2: thorax · axial · 0.64mm/px · z∈[+914,+1184]mm · 12 of 64 slices shown, 15 images]
[im 5/64  mediastinal]
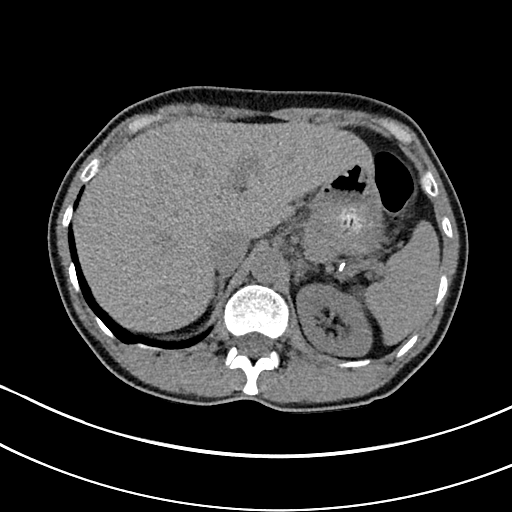
[im 5/64  lung]
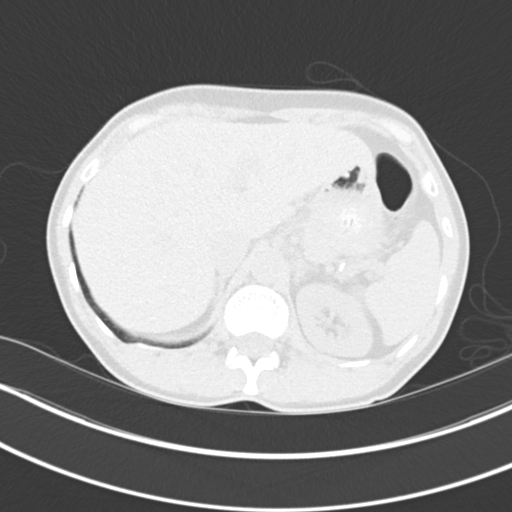
[im 10/64  lung]
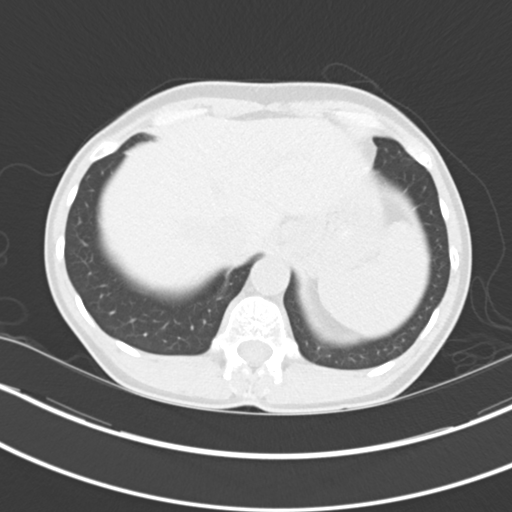
[im 15/64  lung]
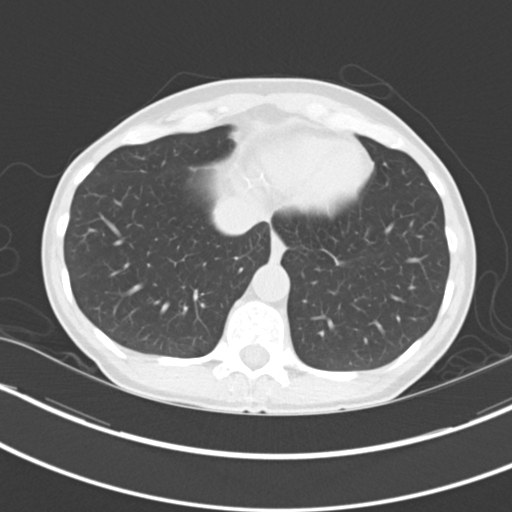
[im 19/64  lung]
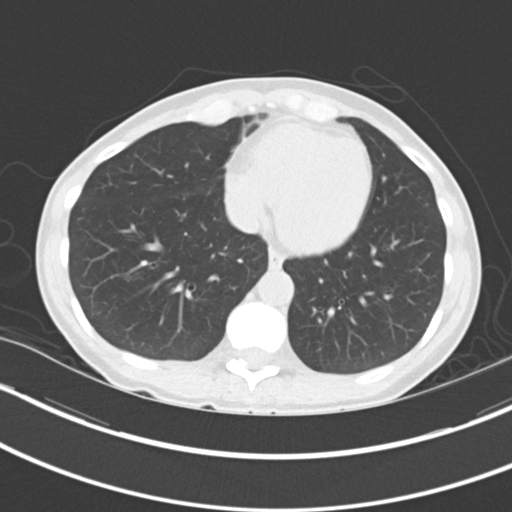
[im 24/64  mediastinal]
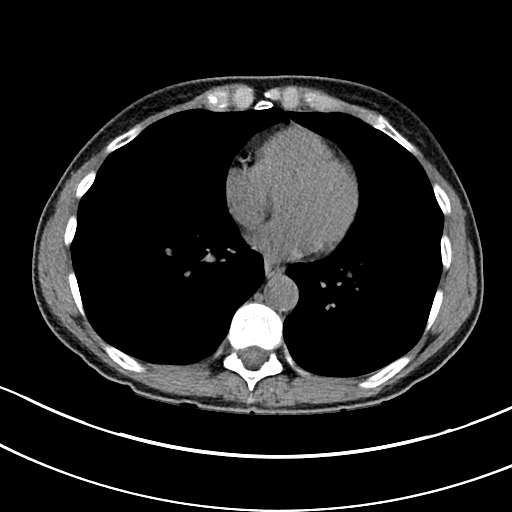
[im 24/64  lung]
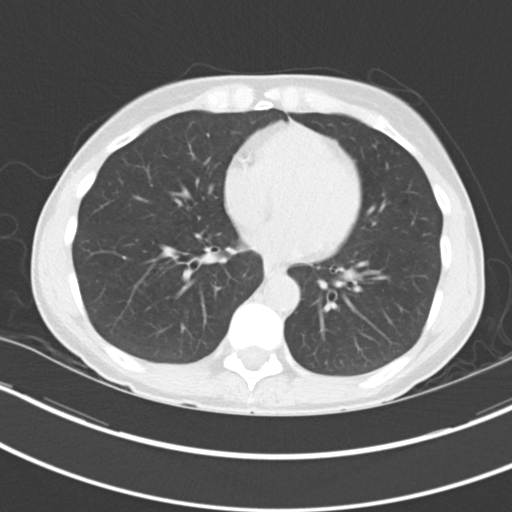
[im 29/64  lung]
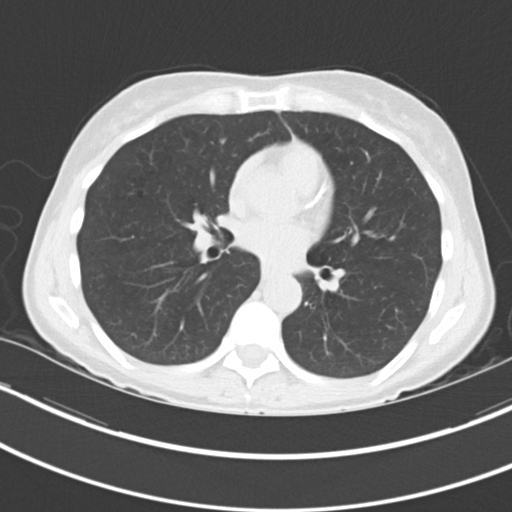
[im 36/64  lung]
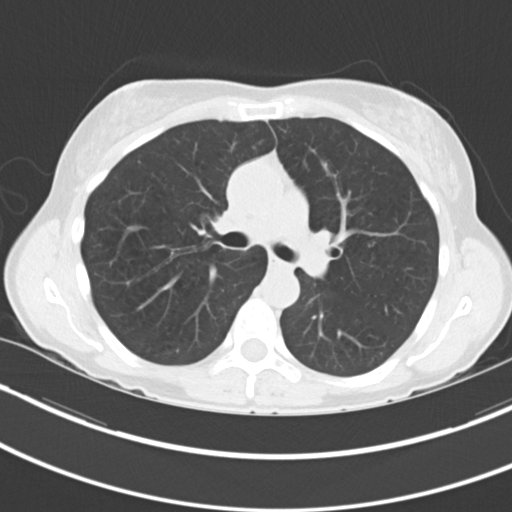
[im 40/64  lung]
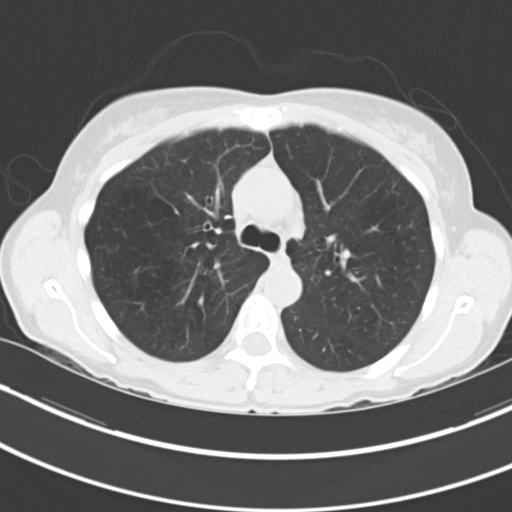
[im 45/64  mediastinal]
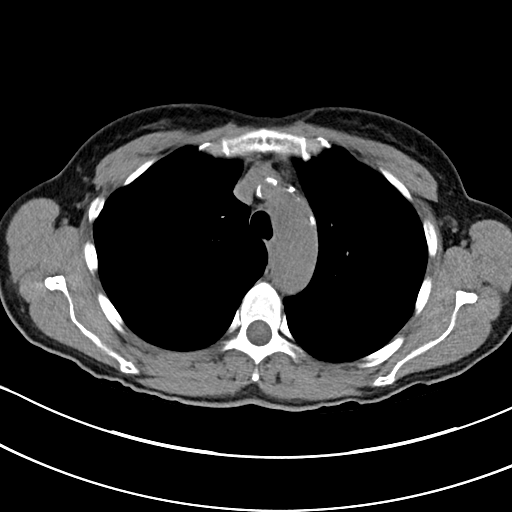
[im 45/64  lung]
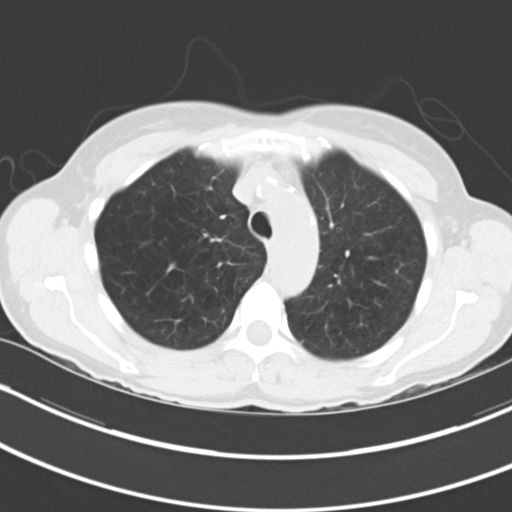
[im 50/64  lung]
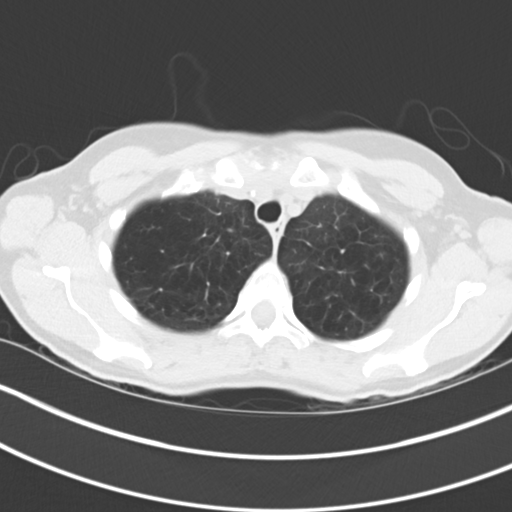
[im 54/64  lung]
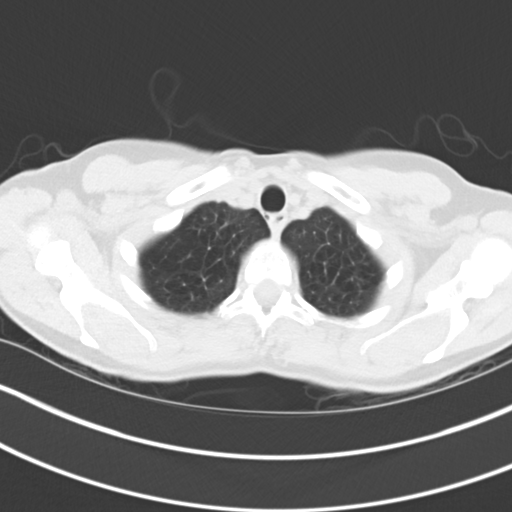
[im 59/64  lung]
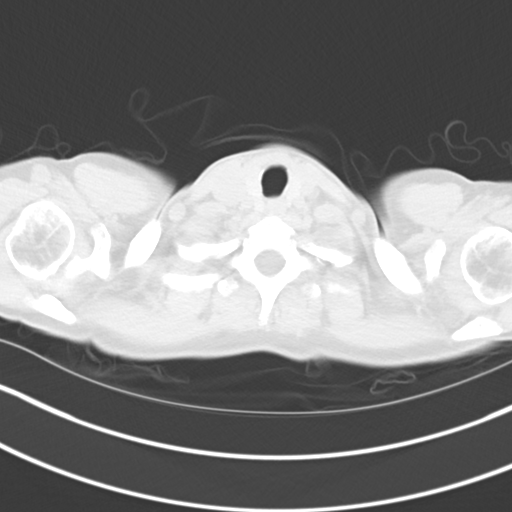

[Series 6: coronal · coronal · 0.63mm/px · 3 of 108 slices shown]
[im 22/108  lung]
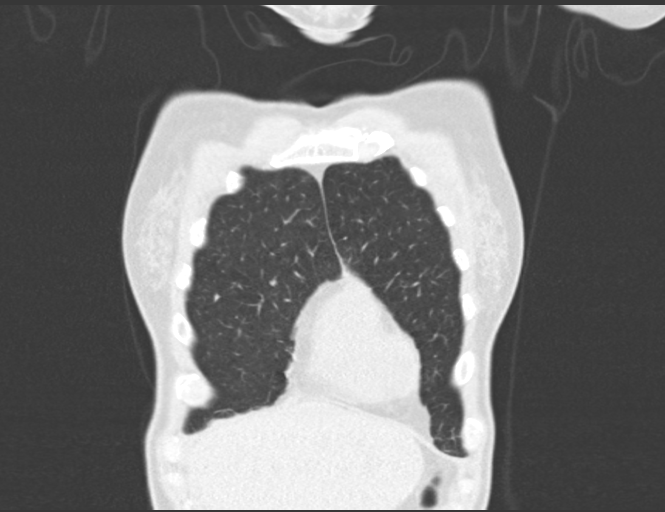
[im 43/108  lung]
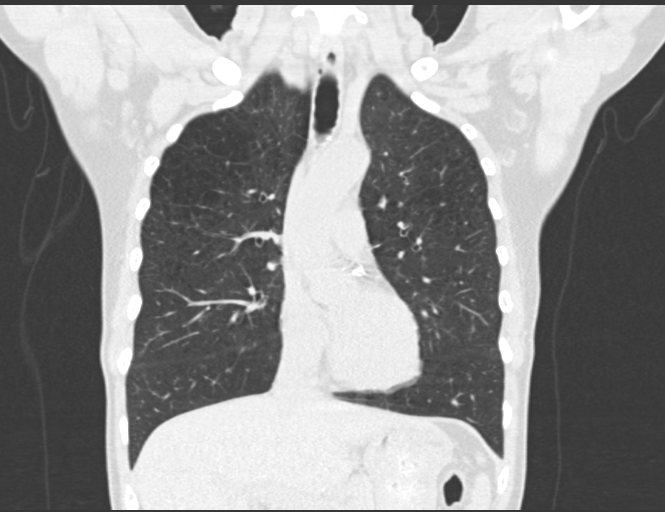
[im 65/108  lung]
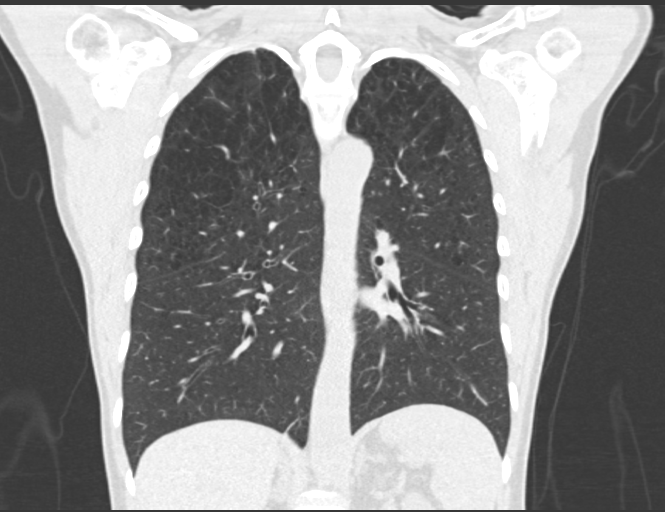

[15 of 36 positions shown; findings below may reference images not displayed]

FINDINGS: Mediastinum / Lymph Nodes: There is no axillary lymphadenopathy. No
mediastinal lymphadenopathy. There is no hilar lymphadenopathy. The
heart size is normal. No pericardial effusion. Coronary artery
calcification is noted. The esophagus has normal imaging features.

Lungs / Pleura: Moderate changes centrilobular emphysema noted
bilaterally. No focal airspace consolidation. No CT findings of
pulmonary edema or pleural effusion. 4 mm right upper lobe pulmonary
nodule (image 38 series 3) is stable back to 05/04/2013, consistent
with a benign etiology. No new pulmonary nodule.

Upper Abdomen:  Unremarkable.

[HOSPITAL] / Soft Tissues: Bone windows reveal no worrisome lytic or
sclerotic osseous lesions. Small sclerotic focus in the T4 vertebral
body is unchanged since 05/04/2013.
IMPRESSION: 1. Stable exam. No change in the 4 mm right upper lobe pulmonary
nodule comparing back 05/04/2013. This 2 years of imaging stability
is compatible with a benign etiology such as scarring.
2. Moderate changes of centrilobular emphysema.
3. Coronary artery atherosclerosis.

## 2017-03-28 DIAGNOSIS — K5090 Crohn's disease, unspecified, without complications: Secondary | ICD-10-CM | POA: Diagnosis not present

## 2017-03-28 DIAGNOSIS — K8020 Calculus of gallbladder without cholecystitis without obstruction: Secondary | ICD-10-CM | POA: Diagnosis not present

## 2017-03-28 DIAGNOSIS — R5383 Other fatigue: Secondary | ICD-10-CM | POA: Diagnosis not present

## 2017-03-28 DIAGNOSIS — R110 Nausea: Secondary | ICD-10-CM | POA: Diagnosis not present

## 2017-03-28 DIAGNOSIS — J449 Chronic obstructive pulmonary disease, unspecified: Secondary | ICD-10-CM | POA: Diagnosis not present

## 2017-03-28 DIAGNOSIS — F419 Anxiety disorder, unspecified: Secondary | ICD-10-CM | POA: Diagnosis not present

## 2017-03-28 DIAGNOSIS — R197 Diarrhea, unspecified: Secondary | ICD-10-CM | POA: Diagnosis not present

## 2017-03-28 DIAGNOSIS — Z720 Tobacco use: Secondary | ICD-10-CM | POA: Diagnosis not present

## 2017-03-28 DIAGNOSIS — E876 Hypokalemia: Secondary | ICD-10-CM | POA: Diagnosis not present

## 2017-03-30 DIAGNOSIS — R110 Nausea: Secondary | ICD-10-CM | POA: Diagnosis not present

## 2017-03-30 DIAGNOSIS — R079 Chest pain, unspecified: Secondary | ICD-10-CM | POA: Diagnosis not present

## 2017-03-30 DIAGNOSIS — R531 Weakness: Secondary | ICD-10-CM | POA: Diagnosis not present

## 2017-03-30 DIAGNOSIS — Z720 Tobacco use: Secondary | ICD-10-CM | POA: Diagnosis not present

## 2017-03-30 DIAGNOSIS — F419 Anxiety disorder, unspecified: Secondary | ICD-10-CM | POA: Diagnosis not present

## 2017-03-30 DIAGNOSIS — R1013 Epigastric pain: Secondary | ICD-10-CM | POA: Diagnosis not present

## 2017-03-30 DIAGNOSIS — I10 Essential (primary) hypertension: Secondary | ICD-10-CM | POA: Diagnosis not present

## 2017-06-25 DIAGNOSIS — I10 Essential (primary) hypertension: Secondary | ICD-10-CM | POA: Diagnosis not present

## 2017-06-25 DIAGNOSIS — Z1159 Encounter for screening for other viral diseases: Secondary | ICD-10-CM | POA: Diagnosis not present

## 2017-06-25 DIAGNOSIS — K8020 Calculus of gallbladder without cholecystitis without obstruction: Secondary | ICD-10-CM | POA: Diagnosis not present

## 2017-07-02 DIAGNOSIS — Z79899 Other long term (current) drug therapy: Secondary | ICD-10-CM | POA: Diagnosis not present

## 2017-07-02 DIAGNOSIS — K219 Gastro-esophageal reflux disease without esophagitis: Secondary | ICD-10-CM | POA: Diagnosis not present

## 2017-07-02 DIAGNOSIS — K8020 Calculus of gallbladder without cholecystitis without obstruction: Secondary | ICD-10-CM | POA: Diagnosis not present

## 2017-07-02 DIAGNOSIS — M797 Fibromyalgia: Secondary | ICD-10-CM | POA: Diagnosis not present

## 2017-07-02 DIAGNOSIS — I2510 Atherosclerotic heart disease of native coronary artery without angina pectoris: Secondary | ICD-10-CM | POA: Diagnosis not present

## 2017-07-02 DIAGNOSIS — F17210 Nicotine dependence, cigarettes, uncomplicated: Secondary | ICD-10-CM | POA: Diagnosis not present

## 2017-07-02 DIAGNOSIS — J449 Chronic obstructive pulmonary disease, unspecified: Secondary | ICD-10-CM | POA: Diagnosis not present

## 2017-07-02 DIAGNOSIS — Z885 Allergy status to narcotic agent status: Secondary | ICD-10-CM | POA: Diagnosis not present

## 2017-07-02 DIAGNOSIS — K8010 Calculus of gallbladder with chronic cholecystitis without obstruction: Secondary | ICD-10-CM | POA: Diagnosis not present

## 2017-07-02 DIAGNOSIS — K565 Intestinal adhesions [bands], unspecified as to partial versus complete obstruction: Secondary | ICD-10-CM | POA: Diagnosis not present

## 2017-07-03 DIAGNOSIS — F419 Anxiety disorder, unspecified: Secondary | ICD-10-CM | POA: Diagnosis not present

## 2017-07-03 DIAGNOSIS — Z79899 Other long term (current) drug therapy: Secondary | ICD-10-CM | POA: Diagnosis not present

## 2017-07-03 DIAGNOSIS — J449 Chronic obstructive pulmonary disease, unspecified: Secondary | ICD-10-CM | POA: Diagnosis not present

## 2017-07-03 DIAGNOSIS — F17210 Nicotine dependence, cigarettes, uncomplicated: Secondary | ICD-10-CM | POA: Diagnosis not present

## 2017-07-03 DIAGNOSIS — R9431 Abnormal electrocardiogram [ECG] [EKG]: Secondary | ICD-10-CM | POA: Diagnosis not present

## 2017-07-03 DIAGNOSIS — I2510 Atherosclerotic heart disease of native coronary artery without angina pectoris: Secondary | ICD-10-CM | POA: Diagnosis not present

## 2017-07-03 DIAGNOSIS — G8918 Other acute postprocedural pain: Secondary | ICD-10-CM | POA: Diagnosis not present

## 2017-07-03 DIAGNOSIS — K838 Other specified diseases of biliary tract: Secondary | ICD-10-CM | POA: Diagnosis not present

## 2017-07-03 DIAGNOSIS — Z9049 Acquired absence of other specified parts of digestive tract: Secondary | ICD-10-CM | POA: Diagnosis not present

## 2017-07-03 DIAGNOSIS — R188 Other ascites: Secondary | ICD-10-CM | POA: Diagnosis not present

## 2017-07-03 DIAGNOSIS — Z885 Allergy status to narcotic agent status: Secondary | ICD-10-CM | POA: Diagnosis not present

## 2017-07-03 DIAGNOSIS — R531 Weakness: Secondary | ICD-10-CM | POA: Diagnosis not present

## 2017-07-03 DIAGNOSIS — K5090 Crohn's disease, unspecified, without complications: Secondary | ICD-10-CM | POA: Diagnosis not present

## 2017-07-03 DIAGNOSIS — Z79891 Long term (current) use of opiate analgesic: Secondary | ICD-10-CM | POA: Diagnosis not present

## 2017-07-03 DIAGNOSIS — M25511 Pain in right shoulder: Secondary | ICD-10-CM | POA: Diagnosis not present

## 2017-07-06 DIAGNOSIS — Z9049 Acquired absence of other specified parts of digestive tract: Secondary | ICD-10-CM | POA: Diagnosis not present

## 2017-07-08 DIAGNOSIS — R1011 Right upper quadrant pain: Secondary | ICD-10-CM | POA: Diagnosis not present

## 2017-07-08 DIAGNOSIS — Z9049 Acquired absence of other specified parts of digestive tract: Secondary | ICD-10-CM | POA: Diagnosis not present

## 2017-07-10 DIAGNOSIS — Z888 Allergy status to other drugs, medicaments and biological substances status: Secondary | ICD-10-CM | POA: Diagnosis not present

## 2017-07-10 DIAGNOSIS — R0789 Other chest pain: Secondary | ICD-10-CM | POA: Diagnosis not present

## 2017-07-10 DIAGNOSIS — R079 Chest pain, unspecified: Secondary | ICD-10-CM | POA: Diagnosis not present

## 2017-07-10 DIAGNOSIS — Z885 Allergy status to narcotic agent status: Secondary | ICD-10-CM | POA: Diagnosis not present

## 2017-07-10 DIAGNOSIS — F419 Anxiety disorder, unspecified: Secondary | ICD-10-CM | POA: Diagnosis not present

## 2017-07-10 DIAGNOSIS — M797 Fibromyalgia: Secondary | ICD-10-CM | POA: Diagnosis not present

## 2017-07-10 DIAGNOSIS — R911 Solitary pulmonary nodule: Secondary | ICD-10-CM | POA: Diagnosis not present

## 2017-07-10 DIAGNOSIS — J439 Emphysema, unspecified: Secondary | ICD-10-CM | POA: Diagnosis not present

## 2017-07-10 DIAGNOSIS — Z91048 Other nonmedicinal substance allergy status: Secondary | ICD-10-CM | POA: Diagnosis not present

## 2017-07-10 DIAGNOSIS — Z79899 Other long term (current) drug therapy: Secondary | ICD-10-CM | POA: Diagnosis not present

## 2017-07-10 DIAGNOSIS — Z9049 Acquired absence of other specified parts of digestive tract: Secondary | ICD-10-CM | POA: Diagnosis not present

## 2017-07-10 DIAGNOSIS — Z9861 Coronary angioplasty status: Secondary | ICD-10-CM | POA: Diagnosis not present

## 2017-07-10 DIAGNOSIS — F17210 Nicotine dependence, cigarettes, uncomplicated: Secondary | ICD-10-CM | POA: Diagnosis not present

## 2017-07-11 DIAGNOSIS — R079 Chest pain, unspecified: Secondary | ICD-10-CM | POA: Diagnosis not present

## 2017-07-11 DIAGNOSIS — R911 Solitary pulmonary nodule: Secondary | ICD-10-CM | POA: Diagnosis not present

## 2017-07-11 DIAGNOSIS — J439 Emphysema, unspecified: Secondary | ICD-10-CM | POA: Diagnosis not present

## 2017-07-23 DIAGNOSIS — Z9049 Acquired absence of other specified parts of digestive tract: Secondary | ICD-10-CM | POA: Diagnosis not present

## 2017-11-04 DIAGNOSIS — D171 Benign lipomatous neoplasm of skin and subcutaneous tissue of trunk: Secondary | ICD-10-CM | POA: Diagnosis not present

## 2017-11-09 DIAGNOSIS — I10 Essential (primary) hypertension: Secondary | ICD-10-CM | POA: Diagnosis not present

## 2017-11-09 DIAGNOSIS — R531 Weakness: Secondary | ICD-10-CM | POA: Diagnosis not present

## 2017-11-09 DIAGNOSIS — F17200 Nicotine dependence, unspecified, uncomplicated: Secondary | ICD-10-CM | POA: Diagnosis not present

## 2017-11-09 DIAGNOSIS — F419 Anxiety disorder, unspecified: Secondary | ICD-10-CM | POA: Diagnosis not present

## 2017-11-09 DIAGNOSIS — E782 Mixed hyperlipidemia: Secondary | ICD-10-CM | POA: Diagnosis not present

## 2017-11-09 DIAGNOSIS — I2510 Atherosclerotic heart disease of native coronary artery without angina pectoris: Secondary | ICD-10-CM | POA: Diagnosis not present

## 2017-11-11 DIAGNOSIS — R5383 Other fatigue: Secondary | ICD-10-CM | POA: Diagnosis not present

## 2017-11-11 DIAGNOSIS — R079 Chest pain, unspecified: Secondary | ICD-10-CM | POA: Diagnosis not present

## 2017-11-11 DIAGNOSIS — R531 Weakness: Secondary | ICD-10-CM | POA: Diagnosis not present

## 2017-11-17 ENCOUNTER — Ambulatory Visit: Payer: Self-pay | Admitting: Cardiovascular Disease

## 2017-11-19 DIAGNOSIS — R5383 Other fatigue: Secondary | ICD-10-CM | POA: Diagnosis not present

## 2017-11-19 DIAGNOSIS — R531 Weakness: Secondary | ICD-10-CM | POA: Diagnosis not present

## 2017-11-19 DIAGNOSIS — R079 Chest pain, unspecified: Secondary | ICD-10-CM | POA: Diagnosis not present

## 2017-11-25 DIAGNOSIS — R5383 Other fatigue: Secondary | ICD-10-CM | POA: Diagnosis not present

## 2017-11-25 DIAGNOSIS — R531 Weakness: Secondary | ICD-10-CM | POA: Diagnosis not present

## 2017-11-25 DIAGNOSIS — R079 Chest pain, unspecified: Secondary | ICD-10-CM | POA: Diagnosis not present

## 2018-02-09 DIAGNOSIS — I700 Atherosclerosis of aorta: Secondary | ICD-10-CM | POA: Diagnosis not present

## 2018-02-09 DIAGNOSIS — R079 Chest pain, unspecified: Secondary | ICD-10-CM | POA: Diagnosis not present

## 2018-05-25 DIAGNOSIS — Z888 Allergy status to other drugs, medicaments and biological substances status: Secondary | ICD-10-CM | POA: Diagnosis not present

## 2018-05-25 DIAGNOSIS — R310 Gross hematuria: Secondary | ICD-10-CM | POA: Diagnosis not present

## 2018-05-25 DIAGNOSIS — Z79899 Other long term (current) drug therapy: Secondary | ICD-10-CM | POA: Diagnosis not present

## 2018-05-25 DIAGNOSIS — J449 Chronic obstructive pulmonary disease, unspecified: Secondary | ICD-10-CM | POA: Diagnosis not present

## 2018-05-25 DIAGNOSIS — F17210 Nicotine dependence, cigarettes, uncomplicated: Secondary | ICD-10-CM | POA: Diagnosis not present

## 2018-05-25 DIAGNOSIS — R5383 Other fatigue: Secondary | ICD-10-CM | POA: Diagnosis not present

## 2018-05-25 DIAGNOSIS — F419 Anxiety disorder, unspecified: Secondary | ICD-10-CM | POA: Diagnosis not present

## 2018-06-16 DIAGNOSIS — N390 Urinary tract infection, site not specified: Secondary | ICD-10-CM | POA: Diagnosis not present

## 2018-06-22 DIAGNOSIS — I10 Essential (primary) hypertension: Secondary | ICD-10-CM | POA: Diagnosis not present

## 2018-06-22 DIAGNOSIS — F329 Major depressive disorder, single episode, unspecified: Secondary | ICD-10-CM | POA: Diagnosis not present

## 2018-06-22 DIAGNOSIS — F17200 Nicotine dependence, unspecified, uncomplicated: Secondary | ICD-10-CM | POA: Diagnosis not present

## 2018-06-22 DIAGNOSIS — M545 Low back pain: Secondary | ICD-10-CM | POA: Diagnosis not present

## 2018-06-22 DIAGNOSIS — K5180 Other ulcerative colitis without complications: Secondary | ICD-10-CM | POA: Diagnosis not present

## 2018-06-22 DIAGNOSIS — R310 Gross hematuria: Secondary | ICD-10-CM | POA: Diagnosis not present

## 2018-06-22 DIAGNOSIS — K5090 Crohn's disease, unspecified, without complications: Secondary | ICD-10-CM | POA: Diagnosis not present

## 2018-08-04 DIAGNOSIS — R079 Chest pain, unspecified: Secondary | ICD-10-CM | POA: Diagnosis not present

## 2018-08-04 DIAGNOSIS — I700 Atherosclerosis of aorta: Secondary | ICD-10-CM | POA: Diagnosis not present

## 2018-09-16 DIAGNOSIS — R1030 Lower abdominal pain, unspecified: Secondary | ICD-10-CM | POA: Diagnosis not present

## 2018-11-01 DIAGNOSIS — F419 Anxiety disorder, unspecified: Secondary | ICD-10-CM | POA: Diagnosis not present

## 2018-11-01 DIAGNOSIS — J439 Emphysema, unspecified: Secondary | ICD-10-CM | POA: Diagnosis not present

## 2018-11-01 DIAGNOSIS — F17200 Nicotine dependence, unspecified, uncomplicated: Secondary | ICD-10-CM | POA: Diagnosis not present

## 2018-11-01 DIAGNOSIS — M545 Low back pain: Secondary | ICD-10-CM | POA: Diagnosis not present

## 2018-11-01 DIAGNOSIS — K5010 Crohn's disease of large intestine without complications: Secondary | ICD-10-CM | POA: Diagnosis not present

## 2018-11-01 DIAGNOSIS — F329 Major depressive disorder, single episode, unspecified: Secondary | ICD-10-CM | POA: Diagnosis not present

## 2018-11-01 DIAGNOSIS — I10 Essential (primary) hypertension: Secondary | ICD-10-CM | POA: Diagnosis not present

## 2018-11-14 DIAGNOSIS — R3915 Urgency of urination: Secondary | ICD-10-CM | POA: Diagnosis not present

## 2018-11-14 DIAGNOSIS — M130 Polyarthritis, unspecified: Secondary | ICD-10-CM | POA: Diagnosis not present

## 2018-12-14 DIAGNOSIS — I209 Angina pectoris, unspecified: Secondary | ICD-10-CM | POA: Diagnosis not present

## 2018-12-14 DIAGNOSIS — R079 Chest pain, unspecified: Secondary | ICD-10-CM | POA: Diagnosis not present

## 2018-12-14 DIAGNOSIS — I700 Atherosclerosis of aorta: Secondary | ICD-10-CM | POA: Diagnosis not present

## 2019-05-03 DIAGNOSIS — R1013 Epigastric pain: Secondary | ICD-10-CM | POA: Diagnosis not present

## 2019-05-03 DIAGNOSIS — R140 Abdominal distension (gaseous): Secondary | ICD-10-CM | POA: Diagnosis not present

## 2019-05-03 DIAGNOSIS — R630 Anorexia: Secondary | ICD-10-CM | POA: Diagnosis not present

## 2019-05-03 DIAGNOSIS — R1084 Generalized abdominal pain: Secondary | ICD-10-CM | POA: Diagnosis not present

## 2019-05-03 DIAGNOSIS — R109 Unspecified abdominal pain: Secondary | ICD-10-CM | POA: Diagnosis not present

## 2019-05-03 DIAGNOSIS — R110 Nausea: Secondary | ICD-10-CM | POA: Diagnosis not present

## 2019-05-10 DIAGNOSIS — K50919 Crohn's disease, unspecified, with unspecified complications: Secondary | ICD-10-CM | POA: Diagnosis not present

## 2019-05-10 DIAGNOSIS — R109 Unspecified abdominal pain: Secondary | ICD-10-CM | POA: Diagnosis not present

## 2019-05-16 DIAGNOSIS — I700 Atherosclerosis of aorta: Secondary | ICD-10-CM | POA: Diagnosis not present

## 2019-05-16 DIAGNOSIS — J432 Centrilobular emphysema: Secondary | ICD-10-CM | POA: Diagnosis not present

## 2019-05-16 DIAGNOSIS — R072 Precordial pain: Secondary | ICD-10-CM | POA: Diagnosis not present

## 2019-05-16 DIAGNOSIS — I10 Essential (primary) hypertension: Secondary | ICD-10-CM | POA: Diagnosis not present

## 2019-05-24 DIAGNOSIS — R0789 Other chest pain: Secondary | ICD-10-CM | POA: Diagnosis not present

## 2019-05-24 DIAGNOSIS — E782 Mixed hyperlipidemia: Secondary | ICD-10-CM | POA: Diagnosis not present

## 2019-05-24 DIAGNOSIS — R1013 Epigastric pain: Secondary | ICD-10-CM | POA: Diagnosis not present

## 2019-05-24 DIAGNOSIS — F411 Generalized anxiety disorder: Secondary | ICD-10-CM | POA: Diagnosis not present

## 2019-05-24 DIAGNOSIS — J439 Emphysema, unspecified: Secondary | ICD-10-CM | POA: Diagnosis not present

## 2019-06-13 DIAGNOSIS — J432 Centrilobular emphysema: Secondary | ICD-10-CM | POA: Diagnosis not present

## 2019-06-13 DIAGNOSIS — I700 Atherosclerosis of aorta: Secondary | ICD-10-CM | POA: Diagnosis not present

## 2019-06-13 DIAGNOSIS — R072 Precordial pain: Secondary | ICD-10-CM | POA: Diagnosis not present

## 2019-06-30 ENCOUNTER — Ambulatory Visit: Payer: Medicare HMO | Attending: Internal Medicine

## 2019-06-30 DIAGNOSIS — Z23 Encounter for immunization: Secondary | ICD-10-CM

## 2019-06-30 NOTE — Progress Notes (Signed)
   Covid-19 Vaccination Clinic  Name:  Allison Greer    MRN: [id number] DOB: 1960/04/30  06/30/2019  [name] was observed post Covid-19 immunization for 15 minutes without incident. She was provided with Vaccine Information Sheet and instruction to access the V-Safe system.   [name] was instructed to call 911 with any severe reactions post vaccine: Marland Kitchen Difficulty breathing  . Swelling of face and throat  . A fast heartbeat  . A bad rash all over body  . Dizziness and weakness   Immunizations Administered    Name Date Dose VIS Date Route   Pfizer COVID-19 Vaccine 06/30/2019  9:21 AM 0.3 mL 03/24/2019 Intramuscular   Manufacturer: [place]   Lot: [id number]   [place]: [id number]

## 2019-07-06 DIAGNOSIS — I2510 Atherosclerotic heart disease of native coronary artery without angina pectoris: Secondary | ICD-10-CM | POA: Diagnosis not present

## 2019-07-06 DIAGNOSIS — I10 Essential (primary) hypertension: Secondary | ICD-10-CM | POA: Diagnosis not present

## 2019-07-19 ENCOUNTER — Ambulatory Visit: Payer: Medicare HMO

## 2019-07-21 ENCOUNTER — Ambulatory Visit: Payer: Medicare HMO | Attending: Internal Medicine

## 2019-07-21 DIAGNOSIS — Z23 Encounter for immunization: Secondary | ICD-10-CM

## 2019-07-21 NOTE — Progress Notes (Signed)
   Covid-19 Vaccination Clinic  Name:  Allison Greer    MRN: [id number] DOB: 23-Apr-1960  07/21/2019  [name] was observed post Covid-19 immunization for 15 minutes without incident. She was provided with Vaccine Information Sheet and instruction to access the V-Safe system.   [name] was instructed to call 911 with any severe reactions post vaccine: Marland Kitchen Difficulty breathing  . Swelling of face and throat  . A fast heartbeat  . A bad rash all over body  . Dizziness and weakness   Immunizations Administered    Name Date Dose VIS Date Route   Pfizer COVID-19 Vaccine 07/21/2019  9:00 AM 0.3 mL 03/24/2019 Intramuscular   Manufacturer: [place]   Lot: [id number]   [place]: [id number]

## 2019-10-13 DIAGNOSIS — H5200 Hypermetropia, unspecified eye: Secondary | ICD-10-CM | POA: Diagnosis not present

## 2019-10-13 DIAGNOSIS — Z135 Encounter for screening for eye and ear disorders: Secondary | ICD-10-CM | POA: Diagnosis not present

## 2019-10-13 DIAGNOSIS — Z0100 Encounter for examination of eyes and vision without abnormal findings: Secondary | ICD-10-CM | POA: Diagnosis not present

## 2019-10-13 DIAGNOSIS — H25813 Combined forms of age-related cataract, bilateral: Secondary | ICD-10-CM | POA: Diagnosis not present

## 2019-11-13 DIAGNOSIS — I10 Essential (primary) hypertension: Secondary | ICD-10-CM | POA: Diagnosis not present

## 2019-11-13 DIAGNOSIS — I2584 Coronary atherosclerosis due to calcified coronary lesion: Secondary | ICD-10-CM | POA: Diagnosis not present

## 2019-11-13 DIAGNOSIS — I2510 Atherosclerotic heart disease of native coronary artery without angina pectoris: Secondary | ICD-10-CM | POA: Diagnosis not present

## 2019-12-19 DIAGNOSIS — K50919 Crohn's disease, unspecified, with unspecified complications: Secondary | ICD-10-CM | POA: Diagnosis not present

## 2019-12-20 DIAGNOSIS — K50919 Crohn's disease, unspecified, with unspecified complications: Secondary | ICD-10-CM | POA: Diagnosis not present

## 2020-01-19 DIAGNOSIS — R140 Abdominal distension (gaseous): Secondary | ICD-10-CM | POA: Diagnosis not present

## 2020-01-19 DIAGNOSIS — R109 Unspecified abdominal pain: Secondary | ICD-10-CM | POA: Diagnosis not present

## 2020-01-19 DIAGNOSIS — K50919 Crohn's disease, unspecified, with unspecified complications: Secondary | ICD-10-CM | POA: Diagnosis not present

## 2020-01-19 DIAGNOSIS — R110 Nausea: Secondary | ICD-10-CM | POA: Diagnosis not present

## 2020-01-19 DIAGNOSIS — R630 Anorexia: Secondary | ICD-10-CM | POA: Diagnosis not present

## 2020-01-19 DIAGNOSIS — I10 Essential (primary) hypertension: Secondary | ICD-10-CM | POA: Diagnosis not present

## 2020-03-26 DIAGNOSIS — F419 Anxiety disorder, unspecified: Secondary | ICD-10-CM | POA: Diagnosis not present

## 2020-03-26 DIAGNOSIS — F329 Major depressive disorder, single episode, unspecified: Secondary | ICD-10-CM | POA: Diagnosis not present

## 2020-03-26 DIAGNOSIS — K219 Gastro-esophageal reflux disease without esophagitis: Secondary | ICD-10-CM | POA: Diagnosis not present

## 2020-03-26 DIAGNOSIS — R739 Hyperglycemia, unspecified: Secondary | ICD-10-CM | POA: Diagnosis not present

## 2020-03-26 DIAGNOSIS — J439 Emphysema, unspecified: Secondary | ICD-10-CM | POA: Diagnosis not present

## 2020-03-26 DIAGNOSIS — K5090 Crohn's disease, unspecified, without complications: Secondary | ICD-10-CM | POA: Diagnosis not present

## 2020-03-26 DIAGNOSIS — M797 Fibromyalgia: Secondary | ICD-10-CM | POA: Diagnosis not present

## 2020-03-26 DIAGNOSIS — E559 Vitamin D deficiency, unspecified: Secondary | ICD-10-CM | POA: Diagnosis not present

## 2020-03-26 DIAGNOSIS — F17200 Nicotine dependence, unspecified, uncomplicated: Secondary | ICD-10-CM | POA: Diagnosis not present

## 2020-03-26 DIAGNOSIS — I2510 Atherosclerotic heart disease of native coronary artery without angina pectoris: Secondary | ICD-10-CM | POA: Diagnosis not present

## 2020-03-26 DIAGNOSIS — E782 Mixed hyperlipidemia: Secondary | ICD-10-CM | POA: Diagnosis not present

## 2020-05-24 DIAGNOSIS — I2510 Atherosclerotic heart disease of native coronary artery without angina pectoris: Secondary | ICD-10-CM | POA: Diagnosis not present

## 2020-05-24 DIAGNOSIS — J432 Centrilobular emphysema: Secondary | ICD-10-CM | POA: Diagnosis not present

## 2020-05-24 DIAGNOSIS — I10 Essential (primary) hypertension: Secondary | ICD-10-CM | POA: Diagnosis not present

## 2020-05-24 DIAGNOSIS — E7800 Pure hypercholesterolemia, unspecified: Secondary | ICD-10-CM | POA: Diagnosis not present

## 2020-05-24 DIAGNOSIS — K5090 Crohn's disease, unspecified, without complications: Secondary | ICD-10-CM | POA: Diagnosis not present

## 2020-05-24 DIAGNOSIS — I2584 Coronary atherosclerosis due to calcified coronary lesion: Secondary | ICD-10-CM | POA: Diagnosis not present

## 2020-06-27 DIAGNOSIS — E782 Mixed hyperlipidemia: Secondary | ICD-10-CM | POA: Diagnosis not present

## 2020-06-27 DIAGNOSIS — E559 Vitamin D deficiency, unspecified: Secondary | ICD-10-CM | POA: Diagnosis not present

## 2020-10-15 DIAGNOSIS — H5200 Hypermetropia, unspecified eye: Secondary | ICD-10-CM | POA: Diagnosis not present

## 2020-10-15 DIAGNOSIS — H25813 Combined forms of age-related cataract, bilateral: Secondary | ICD-10-CM | POA: Diagnosis not present

## 2020-10-16 DIAGNOSIS — H524 Presbyopia: Secondary | ICD-10-CM | POA: Diagnosis not present

## 2020-10-16 DIAGNOSIS — H52209 Unspecified astigmatism, unspecified eye: Secondary | ICD-10-CM | POA: Diagnosis not present

## 2020-10-16 DIAGNOSIS — Z0100 Encounter for examination of eyes and vision without abnormal findings: Secondary | ICD-10-CM | POA: Diagnosis not present

## 2020-10-16 DIAGNOSIS — H5203 Hypermetropia, bilateral: Secondary | ICD-10-CM | POA: Diagnosis not present

## 2021-05-09 DIAGNOSIS — M797 Fibromyalgia: Secondary | ICD-10-CM | POA: Diagnosis not present

## 2021-05-09 DIAGNOSIS — F329 Major depressive disorder, single episode, unspecified: Secondary | ICD-10-CM | POA: Diagnosis not present

## 2021-05-09 DIAGNOSIS — R739 Hyperglycemia, unspecified: Secondary | ICD-10-CM | POA: Diagnosis not present

## 2021-05-09 DIAGNOSIS — N3000 Acute cystitis without hematuria: Secondary | ICD-10-CM | POA: Diagnosis not present

## 2021-05-09 DIAGNOSIS — J439 Emphysema, unspecified: Secondary | ICD-10-CM | POA: Diagnosis not present

## 2021-05-09 DIAGNOSIS — I10 Essential (primary) hypertension: Secondary | ICD-10-CM | POA: Diagnosis not present

## 2021-05-09 DIAGNOSIS — E782 Mixed hyperlipidemia: Secondary | ICD-10-CM | POA: Diagnosis not present

## 2021-05-09 DIAGNOSIS — I2510 Atherosclerotic heart disease of native coronary artery without angina pectoris: Secondary | ICD-10-CM | POA: Diagnosis not present

## 2021-05-09 DIAGNOSIS — E559 Vitamin D deficiency, unspecified: Secondary | ICD-10-CM | POA: Diagnosis not present

## 2021-05-09 DIAGNOSIS — F411 Generalized anxiety disorder: Secondary | ICD-10-CM | POA: Diagnosis not present

## 2021-07-10 DIAGNOSIS — E7849 Other hyperlipidemia: Secondary | ICD-10-CM | POA: Diagnosis not present

## 2021-07-10 DIAGNOSIS — I10 Essential (primary) hypertension: Secondary | ICD-10-CM | POA: Diagnosis not present

## 2021-07-10 DIAGNOSIS — I2510 Atherosclerotic heart disease of native coronary artery without angina pectoris: Secondary | ICD-10-CM | POA: Diagnosis not present

## 2021-07-10 DIAGNOSIS — I2584 Coronary atherosclerosis due to calcified coronary lesion: Secondary | ICD-10-CM | POA: Diagnosis not present

## 2021-07-10 DIAGNOSIS — F17200 Nicotine dependence, unspecified, uncomplicated: Secondary | ICD-10-CM | POA: Diagnosis not present

## 2021-07-10 DIAGNOSIS — K5090 Crohn's disease, unspecified, without complications: Secondary | ICD-10-CM | POA: Diagnosis not present

## 2021-08-08 DIAGNOSIS — I25118 Atherosclerotic heart disease of native coronary artery with other forms of angina pectoris: Secondary | ICD-10-CM | POA: Diagnosis not present

## 2021-08-08 DIAGNOSIS — F17200 Nicotine dependence, unspecified, uncomplicated: Secondary | ICD-10-CM | POA: Diagnosis not present

## 2021-08-08 DIAGNOSIS — Z7982 Long term (current) use of aspirin: Secondary | ICD-10-CM | POA: Diagnosis not present

## 2021-08-08 DIAGNOSIS — I2510 Atherosclerotic heart disease of native coronary artery without angina pectoris: Secondary | ICD-10-CM | POA: Diagnosis not present

## 2021-08-08 DIAGNOSIS — I10 Essential (primary) hypertension: Secondary | ICD-10-CM | POA: Diagnosis not present

## 2021-08-08 DIAGNOSIS — I2584 Coronary atherosclerosis due to calcified coronary lesion: Secondary | ICD-10-CM | POA: Diagnosis not present

## 2021-08-08 DIAGNOSIS — E785 Hyperlipidemia, unspecified: Secondary | ICD-10-CM | POA: Diagnosis not present

## 2021-08-08 DIAGNOSIS — R072 Precordial pain: Secondary | ICD-10-CM | POA: Diagnosis not present

## 2021-08-08 DIAGNOSIS — E7800 Pure hypercholesterolemia, unspecified: Secondary | ICD-10-CM | POA: Diagnosis not present

## 2021-08-08 DIAGNOSIS — Z79899 Other long term (current) drug therapy: Secondary | ICD-10-CM | POA: Diagnosis not present

## 2021-08-08 DIAGNOSIS — Z720 Tobacco use: Secondary | ICD-10-CM | POA: Diagnosis not present

## 2021-10-20 DIAGNOSIS — H6121 Impacted cerumen, right ear: Secondary | ICD-10-CM | POA: Diagnosis not present

## 2021-10-20 DIAGNOSIS — J069 Acute upper respiratory infection, unspecified: Secondary | ICD-10-CM | POA: Diagnosis not present

## 2021-10-20 DIAGNOSIS — I10 Essential (primary) hypertension: Secondary | ICD-10-CM | POA: Diagnosis not present

## 2021-10-29 DIAGNOSIS — H25813 Combined forms of age-related cataract, bilateral: Secondary | ICD-10-CM | POA: Diagnosis not present

## 2021-10-29 DIAGNOSIS — H524 Presbyopia: Secondary | ICD-10-CM | POA: Diagnosis not present

## 2021-10-29 DIAGNOSIS — H5203 Hypermetropia, bilateral: Secondary | ICD-10-CM | POA: Diagnosis not present

## 2021-11-07 DIAGNOSIS — H6121 Impacted cerumen, right ear: Secondary | ICD-10-CM | POA: Diagnosis not present

## 2021-11-07 DIAGNOSIS — J439 Emphysema, unspecified: Secondary | ICD-10-CM | POA: Diagnosis not present

## 2021-11-07 DIAGNOSIS — F419 Anxiety disorder, unspecified: Secondary | ICD-10-CM | POA: Diagnosis not present

## 2021-11-07 DIAGNOSIS — E782 Mixed hyperlipidemia: Secondary | ICD-10-CM | POA: Diagnosis not present

## 2021-11-07 DIAGNOSIS — J40 Bronchitis, not specified as acute or chronic: Secondary | ICD-10-CM | POA: Diagnosis not present

## 2021-11-07 DIAGNOSIS — I10 Essential (primary) hypertension: Secondary | ICD-10-CM | POA: Diagnosis not present

## 2021-11-07 DIAGNOSIS — I2510 Atherosclerotic heart disease of native coronary artery without angina pectoris: Secondary | ICD-10-CM | POA: Diagnosis not present

## 2021-11-07 DIAGNOSIS — E559 Vitamin D deficiency, unspecified: Secondary | ICD-10-CM | POA: Diagnosis not present

## 2021-11-07 DIAGNOSIS — E538 Deficiency of other specified B group vitamins: Secondary | ICD-10-CM | POA: Diagnosis not present

## 2021-11-07 DIAGNOSIS — K5090 Crohn's disease, unspecified, without complications: Secondary | ICD-10-CM | POA: Diagnosis not present

## 2021-11-07 DIAGNOSIS — R739 Hyperglycemia, unspecified: Secondary | ICD-10-CM | POA: Diagnosis not present

## 2021-11-12 DIAGNOSIS — F17210 Nicotine dependence, cigarettes, uncomplicated: Secondary | ICD-10-CM | POA: Diagnosis not present

## 2021-11-12 DIAGNOSIS — H6121 Impacted cerumen, right ear: Secondary | ICD-10-CM | POA: Diagnosis not present

## 2021-11-12 DIAGNOSIS — K5090 Crohn's disease, unspecified, without complications: Secondary | ICD-10-CM | POA: Diagnosis not present

## 2022-03-25 DIAGNOSIS — R5383 Other fatigue: Secondary | ICD-10-CM | POA: Diagnosis not present

## 2022-03-25 DIAGNOSIS — F418 Other specified anxiety disorders: Secondary | ICD-10-CM | POA: Diagnosis not present

## 2022-03-25 DIAGNOSIS — R3915 Urgency of urination: Secondary | ICD-10-CM | POA: Diagnosis not present

## 2022-03-25 DIAGNOSIS — R7303 Prediabetes: Secondary | ICD-10-CM | POA: Diagnosis not present

## 2022-03-25 DIAGNOSIS — R7309 Other abnormal glucose: Secondary | ICD-10-CM | POA: Diagnosis not present

## 2022-05-15 DIAGNOSIS — I10 Essential (primary) hypertension: Secondary | ICD-10-CM | POA: Diagnosis not present

## 2022-05-15 DIAGNOSIS — J439 Emphysema, unspecified: Secondary | ICD-10-CM | POA: Diagnosis not present

## 2022-05-15 DIAGNOSIS — E559 Vitamin D deficiency, unspecified: Secondary | ICD-10-CM | POA: Diagnosis not present

## 2022-05-15 DIAGNOSIS — F419 Anxiety disorder, unspecified: Secondary | ICD-10-CM | POA: Diagnosis not present

## 2022-05-15 DIAGNOSIS — I2510 Atherosclerotic heart disease of native coronary artery without angina pectoris: Secondary | ICD-10-CM | POA: Diagnosis not present

## 2022-05-15 DIAGNOSIS — E782 Mixed hyperlipidemia: Secondary | ICD-10-CM | POA: Diagnosis not present

## 2022-05-15 DIAGNOSIS — M797 Fibromyalgia: Secondary | ICD-10-CM | POA: Diagnosis not present

## 2022-05-25 DIAGNOSIS — I2510 Atherosclerotic heart disease of native coronary artery without angina pectoris: Secondary | ICD-10-CM | POA: Diagnosis not present

## 2022-05-25 DIAGNOSIS — M545 Low back pain, unspecified: Secondary | ICD-10-CM | POA: Diagnosis not present

## 2022-05-25 DIAGNOSIS — K5090 Crohn's disease, unspecified, without complications: Secondary | ICD-10-CM | POA: Diagnosis not present

## 2022-05-25 DIAGNOSIS — R079 Chest pain, unspecified: Secondary | ICD-10-CM | POA: Diagnosis not present

## 2022-05-25 DIAGNOSIS — Z888 Allergy status to other drugs, medicaments and biological substances status: Secondary | ICD-10-CM | POA: Diagnosis not present

## 2022-05-25 DIAGNOSIS — M5136 Other intervertebral disc degeneration, lumbar region: Secondary | ICD-10-CM | POA: Diagnosis not present

## 2022-05-25 DIAGNOSIS — M79601 Pain in right arm: Secondary | ICD-10-CM | POA: Diagnosis not present

## 2022-05-25 DIAGNOSIS — Z79899 Other long term (current) drug therapy: Secondary | ICD-10-CM | POA: Diagnosis not present

## 2022-05-25 DIAGNOSIS — M5414 Radiculopathy, thoracic region: Secondary | ICD-10-CM | POA: Diagnosis not present

## 2022-05-25 DIAGNOSIS — J449 Chronic obstructive pulmonary disease, unspecified: Secondary | ICD-10-CM | POA: Diagnosis not present

## 2022-05-25 DIAGNOSIS — M79602 Pain in left arm: Secondary | ICD-10-CM | POA: Diagnosis not present

## 2022-05-25 DIAGNOSIS — F17210 Nicotine dependence, cigarettes, uncomplicated: Secondary | ICD-10-CM | POA: Diagnosis not present

## 2022-06-01 DIAGNOSIS — M4802 Spinal stenosis, cervical region: Secondary | ICD-10-CM | POA: Diagnosis not present

## 2022-06-01 DIAGNOSIS — M5030 Other cervical disc degeneration, unspecified cervical region: Secondary | ICD-10-CM | POA: Diagnosis not present

## 2022-06-01 DIAGNOSIS — M79601 Pain in right arm: Secondary | ICD-10-CM | POA: Diagnosis not present

## 2022-06-01 DIAGNOSIS — M47812 Spondylosis without myelopathy or radiculopathy, cervical region: Secondary | ICD-10-CM | POA: Diagnosis not present

## 2022-06-01 DIAGNOSIS — Z1330 Encounter for screening examination for mental health and behavioral disorders, unspecified: Secondary | ICD-10-CM | POA: Diagnosis not present

## 2022-06-01 DIAGNOSIS — M4722 Other spondylosis with radiculopathy, cervical region: Secondary | ICD-10-CM | POA: Diagnosis not present

## 2022-06-30 DIAGNOSIS — M5030 Other cervical disc degeneration, unspecified cervical region: Secondary | ICD-10-CM | POA: Diagnosis not present

## 2022-06-30 DIAGNOSIS — M4802 Spinal stenosis, cervical region: Secondary | ICD-10-CM | POA: Diagnosis not present

## 2022-06-30 DIAGNOSIS — M4722 Other spondylosis with radiculopathy, cervical region: Secondary | ICD-10-CM | POA: Diagnosis not present

## 2022-07-07 DIAGNOSIS — E782 Mixed hyperlipidemia: Secondary | ICD-10-CM | POA: Diagnosis not present

## 2022-07-07 DIAGNOSIS — I2584 Coronary atherosclerosis due to calcified coronary lesion: Secondary | ICD-10-CM | POA: Diagnosis not present

## 2022-07-07 DIAGNOSIS — I10 Essential (primary) hypertension: Secondary | ICD-10-CM | POA: Diagnosis not present

## 2022-07-07 DIAGNOSIS — F17200 Nicotine dependence, unspecified, uncomplicated: Secondary | ICD-10-CM | POA: Diagnosis not present

## 2022-07-07 DIAGNOSIS — I2510 Atherosclerotic heart disease of native coronary artery without angina pectoris: Secondary | ICD-10-CM | POA: Diagnosis not present

## 2022-08-10 DIAGNOSIS — M47812 Spondylosis without myelopathy or radiculopathy, cervical region: Secondary | ICD-10-CM | POA: Diagnosis not present

## 2022-08-10 DIAGNOSIS — I10 Essential (primary) hypertension: Secondary | ICD-10-CM | POA: Diagnosis not present

## 2022-08-10 DIAGNOSIS — M5136 Other intervertebral disc degeneration, lumbar region: Secondary | ICD-10-CM | POA: Diagnosis not present

## 2022-08-10 DIAGNOSIS — M47816 Spondylosis without myelopathy or radiculopathy, lumbar region: Secondary | ICD-10-CM | POA: Diagnosis not present

## 2022-08-10 DIAGNOSIS — M4317 Spondylolisthesis, lumbosacral region: Secondary | ICD-10-CM | POA: Diagnosis not present

## 2022-08-10 DIAGNOSIS — M4722 Other spondylosis with radiculopathy, cervical region: Secondary | ICD-10-CM | POA: Diagnosis not present

## 2022-08-10 DIAGNOSIS — M5030 Other cervical disc degeneration, unspecified cervical region: Secondary | ICD-10-CM | POA: Diagnosis not present

## 2022-08-10 DIAGNOSIS — G894 Chronic pain syndrome: Secondary | ICD-10-CM | POA: Diagnosis not present

## 2022-08-10 DIAGNOSIS — M4802 Spinal stenosis, cervical region: Secondary | ICD-10-CM | POA: Diagnosis not present

## 2022-08-10 DIAGNOSIS — M48061 Spinal stenosis, lumbar region without neurogenic claudication: Secondary | ICD-10-CM | POA: Diagnosis not present

## 2022-08-10 DIAGNOSIS — M47817 Spondylosis without myelopathy or radiculopathy, lumbosacral region: Secondary | ICD-10-CM | POA: Diagnosis not present

## 2022-08-18 DIAGNOSIS — I10 Essential (primary) hypertension: Secondary | ICD-10-CM | POA: Diagnosis not present

## 2022-08-18 DIAGNOSIS — K5010 Crohn's disease of large intestine without complications: Secondary | ICD-10-CM | POA: Diagnosis not present

## 2022-08-18 DIAGNOSIS — E559 Vitamin D deficiency, unspecified: Secondary | ICD-10-CM | POA: Diagnosis not present

## 2022-08-18 DIAGNOSIS — R3915 Urgency of urination: Secondary | ICD-10-CM | POA: Diagnosis not present

## 2022-08-18 DIAGNOSIS — F3341 Major depressive disorder, recurrent, in partial remission: Secondary | ICD-10-CM | POA: Diagnosis not present

## 2022-08-18 DIAGNOSIS — E782 Mixed hyperlipidemia: Secondary | ICD-10-CM | POA: Diagnosis not present

## 2022-08-18 DIAGNOSIS — J439 Emphysema, unspecified: Secondary | ICD-10-CM | POA: Diagnosis not present

## 2022-08-18 DIAGNOSIS — I25119 Atherosclerotic heart disease of native coronary artery with unspecified angina pectoris: Secondary | ICD-10-CM | POA: Diagnosis not present

## 2022-08-18 DIAGNOSIS — F419 Anxiety disorder, unspecified: Secondary | ICD-10-CM | POA: Diagnosis not present

## 2022-09-02 DIAGNOSIS — I2584 Coronary atherosclerosis due to calcified coronary lesion: Secondary | ICD-10-CM | POA: Diagnosis not present

## 2022-09-02 DIAGNOSIS — I10 Essential (primary) hypertension: Secondary | ICD-10-CM | POA: Diagnosis not present

## 2022-09-02 DIAGNOSIS — I2510 Atherosclerotic heart disease of native coronary artery without angina pectoris: Secondary | ICD-10-CM | POA: Diagnosis not present

## 2022-09-21 DIAGNOSIS — M47816 Spondylosis without myelopathy or radiculopathy, lumbar region: Secondary | ICD-10-CM | POA: Diagnosis not present

## 2022-09-21 DIAGNOSIS — M461 Sacroiliitis, not elsewhere classified: Secondary | ICD-10-CM | POA: Diagnosis not present

## 2022-09-21 DIAGNOSIS — M5136 Other intervertebral disc degeneration, lumbar region: Secondary | ICD-10-CM | POA: Diagnosis not present

## 2022-09-21 DIAGNOSIS — I10 Essential (primary) hypertension: Secondary | ICD-10-CM | POA: Diagnosis not present

## 2022-09-21 DIAGNOSIS — M48061 Spinal stenosis, lumbar region without neurogenic claudication: Secondary | ICD-10-CM | POA: Diagnosis not present

## 2022-09-21 DIAGNOSIS — G894 Chronic pain syndrome: Secondary | ICD-10-CM | POA: Diagnosis not present

## 2022-10-21 DIAGNOSIS — M461 Sacroiliitis, not elsewhere classified: Secondary | ICD-10-CM | POA: Diagnosis not present

## 2022-11-24 DIAGNOSIS — H43813 Vitreous degeneration, bilateral: Secondary | ICD-10-CM | POA: Diagnosis not present

## 2022-11-24 DIAGNOSIS — H524 Presbyopia: Secondary | ICD-10-CM | POA: Diagnosis not present

## 2022-11-24 DIAGNOSIS — H5203 Hypermetropia, bilateral: Secondary | ICD-10-CM | POA: Diagnosis not present

## 2022-11-24 DIAGNOSIS — Z135 Encounter for screening for eye and ear disorders: Secondary | ICD-10-CM | POA: Diagnosis not present

## 2022-11-24 DIAGNOSIS — H25813 Combined forms of age-related cataract, bilateral: Secondary | ICD-10-CM | POA: Diagnosis not present

## 2022-12-02 DIAGNOSIS — M4722 Other spondylosis with radiculopathy, cervical region: Secondary | ICD-10-CM | POA: Diagnosis not present

## 2022-12-02 DIAGNOSIS — M47812 Spondylosis without myelopathy or radiculopathy, cervical region: Secondary | ICD-10-CM | POA: Diagnosis not present

## 2022-12-02 DIAGNOSIS — I10 Essential (primary) hypertension: Secondary | ICD-10-CM | POA: Diagnosis not present

## 2022-12-02 DIAGNOSIS — M461 Sacroiliitis, not elsewhere classified: Secondary | ICD-10-CM | POA: Diagnosis not present

## 2022-12-02 DIAGNOSIS — M4802 Spinal stenosis, cervical region: Secondary | ICD-10-CM | POA: Diagnosis not present

## 2022-12-02 DIAGNOSIS — G894 Chronic pain syndrome: Secondary | ICD-10-CM | POA: Diagnosis not present

## 2022-12-02 DIAGNOSIS — M5030 Other cervical disc degeneration, unspecified cervical region: Secondary | ICD-10-CM | POA: Diagnosis not present

## 2022-12-15 DIAGNOSIS — F17200 Nicotine dependence, unspecified, uncomplicated: Secondary | ICD-10-CM | POA: Diagnosis not present

## 2022-12-15 DIAGNOSIS — J0140 Acute pansinusitis, unspecified: Secondary | ICD-10-CM | POA: Diagnosis not present

## 2022-12-15 DIAGNOSIS — I10 Essential (primary) hypertension: Secondary | ICD-10-CM | POA: Diagnosis not present

## 2022-12-15 DIAGNOSIS — F411 Generalized anxiety disorder: Secondary | ICD-10-CM | POA: Diagnosis not present

## 2022-12-15 DIAGNOSIS — E782 Mixed hyperlipidemia: Secondary | ICD-10-CM | POA: Diagnosis not present

## 2022-12-15 DIAGNOSIS — I2510 Atherosclerotic heart disease of native coronary artery without angina pectoris: Secondary | ICD-10-CM | POA: Diagnosis not present

## 2022-12-15 DIAGNOSIS — K5010 Crohn's disease of large intestine without complications: Secondary | ICD-10-CM | POA: Diagnosis not present

## 2022-12-15 DIAGNOSIS — J439 Emphysema, unspecified: Secondary | ICD-10-CM | POA: Diagnosis not present

## 2023-02-10 DIAGNOSIS — M461 Sacroiliitis, not elsewhere classified: Secondary | ICD-10-CM | POA: Diagnosis not present

## 2023-02-18 DIAGNOSIS — I10 Essential (primary) hypertension: Secondary | ICD-10-CM | POA: Diagnosis not present

## 2023-02-18 DIAGNOSIS — Z716 Tobacco abuse counseling: Secondary | ICD-10-CM | POA: Diagnosis not present

## 2023-02-18 DIAGNOSIS — I2510 Atherosclerotic heart disease of native coronary artery without angina pectoris: Secondary | ICD-10-CM | POA: Diagnosis not present

## 2023-02-18 DIAGNOSIS — E782 Mixed hyperlipidemia: Secondary | ICD-10-CM | POA: Diagnosis not present

## 2023-05-14 DIAGNOSIS — M5030 Other cervical disc degeneration, unspecified cervical region: Secondary | ICD-10-CM | POA: Diagnosis not present

## 2023-05-14 DIAGNOSIS — M461 Sacroiliitis, not elsewhere classified: Secondary | ICD-10-CM | POA: Diagnosis not present

## 2023-05-14 DIAGNOSIS — M4802 Spinal stenosis, cervical region: Secondary | ICD-10-CM | POA: Diagnosis not present

## 2023-05-14 DIAGNOSIS — G894 Chronic pain syndrome: Secondary | ICD-10-CM | POA: Diagnosis not present

## 2023-05-14 DIAGNOSIS — M4722 Other spondylosis with radiculopathy, cervical region: Secondary | ICD-10-CM | POA: Diagnosis not present

## 2023-05-14 DIAGNOSIS — M47812 Spondylosis without myelopathy or radiculopathy, cervical region: Secondary | ICD-10-CM | POA: Diagnosis not present

## 2023-06-15 DIAGNOSIS — G894 Chronic pain syndrome: Secondary | ICD-10-CM | POA: Diagnosis not present

## 2023-06-15 DIAGNOSIS — M6281 Muscle weakness (generalized): Secondary | ICD-10-CM | POA: Diagnosis not present

## 2023-06-15 DIAGNOSIS — F419 Anxiety disorder, unspecified: Secondary | ICD-10-CM | POA: Diagnosis not present

## 2023-06-15 DIAGNOSIS — K2100 Gastro-esophageal reflux disease with esophagitis, without bleeding: Secondary | ICD-10-CM | POA: Diagnosis not present

## 2023-06-15 DIAGNOSIS — Z79899 Other long term (current) drug therapy: Secondary | ICD-10-CM | POA: Diagnosis not present

## 2023-06-15 DIAGNOSIS — E782 Mixed hyperlipidemia: Secondary | ICD-10-CM | POA: Diagnosis not present

## 2023-06-15 DIAGNOSIS — J439 Emphysema, unspecified: Secondary | ICD-10-CM | POA: Diagnosis not present

## 2023-06-15 DIAGNOSIS — M8580 Other specified disorders of bone density and structure, unspecified site: Secondary | ICD-10-CM | POA: Diagnosis not present

## 2023-06-15 DIAGNOSIS — Z720 Tobacco use: Secondary | ICD-10-CM | POA: Diagnosis not present

## 2023-06-15 DIAGNOSIS — I2510 Atherosclerotic heart disease of native coronary artery without angina pectoris: Secondary | ICD-10-CM | POA: Diagnosis not present

## 2023-06-15 DIAGNOSIS — F32A Depression, unspecified: Secondary | ICD-10-CM | POA: Diagnosis not present

## 2023-06-15 DIAGNOSIS — I10 Essential (primary) hypertension: Secondary | ICD-10-CM | POA: Diagnosis not present

## 2023-06-17 DIAGNOSIS — M461 Sacroiliitis, not elsewhere classified: Secondary | ICD-10-CM | POA: Diagnosis not present

## 2023-08-17 DIAGNOSIS — G894 Chronic pain syndrome: Secondary | ICD-10-CM | POA: Diagnosis not present

## 2023-08-17 DIAGNOSIS — M461 Sacroiliitis, not elsewhere classified: Secondary | ICD-10-CM | POA: Diagnosis not present

## 2023-08-17 DIAGNOSIS — M4722 Other spondylosis with radiculopathy, cervical region: Secondary | ICD-10-CM | POA: Diagnosis not present

## 2023-08-17 DIAGNOSIS — M47816 Spondylosis without myelopathy or radiculopathy, lumbar region: Secondary | ICD-10-CM | POA: Diagnosis not present

## 2023-08-17 DIAGNOSIS — M48061 Spinal stenosis, lumbar region without neurogenic claudication: Secondary | ICD-10-CM | POA: Diagnosis not present

## 2023-08-24 DIAGNOSIS — R072 Precordial pain: Secondary | ICD-10-CM | POA: Diagnosis not present

## 2023-08-24 DIAGNOSIS — E782 Mixed hyperlipidemia: Secondary | ICD-10-CM | POA: Diagnosis not present

## 2023-08-24 DIAGNOSIS — I10 Essential (primary) hypertension: Secondary | ICD-10-CM | POA: Diagnosis not present

## 2023-08-24 DIAGNOSIS — I2510 Atherosclerotic heart disease of native coronary artery without angina pectoris: Secondary | ICD-10-CM | POA: Diagnosis not present

## 2023-09-01 DIAGNOSIS — R197 Diarrhea, unspecified: Secondary | ICD-10-CM | POA: Diagnosis not present

## 2023-09-01 DIAGNOSIS — K219 Gastro-esophageal reflux disease without esophagitis: Secondary | ICD-10-CM | POA: Diagnosis not present

## 2023-09-01 DIAGNOSIS — R1084 Generalized abdominal pain: Secondary | ICD-10-CM | POA: Diagnosis not present

## 2023-09-07 DIAGNOSIS — I2510 Atherosclerotic heart disease of native coronary artery without angina pectoris: Secondary | ICD-10-CM | POA: Diagnosis not present

## 2023-09-07 DIAGNOSIS — R072 Precordial pain: Secondary | ICD-10-CM | POA: Diagnosis not present

## 2023-09-07 DIAGNOSIS — I10 Essential (primary) hypertension: Secondary | ICD-10-CM | POA: Diagnosis not present

## 2023-09-08 DIAGNOSIS — M48061 Spinal stenosis, lumbar region without neurogenic claudication: Secondary | ICD-10-CM | POA: Diagnosis not present

## 2023-09-08 DIAGNOSIS — M47816 Spondylosis without myelopathy or radiculopathy, lumbar region: Secondary | ICD-10-CM | POA: Diagnosis not present

## 2023-09-17 DIAGNOSIS — Z13820 Encounter for screening for osteoporosis: Secondary | ICD-10-CM | POA: Diagnosis not present

## 2023-09-17 DIAGNOSIS — J439 Emphysema, unspecified: Secondary | ICD-10-CM | POA: Diagnosis not present

## 2023-09-17 DIAGNOSIS — Z1231 Encounter for screening mammogram for malignant neoplasm of breast: Secondary | ICD-10-CM | POA: Diagnosis not present

## 2023-09-17 DIAGNOSIS — Z122 Encounter for screening for malignant neoplasm of respiratory organs: Secondary | ICD-10-CM | POA: Diagnosis not present

## 2023-09-17 DIAGNOSIS — Z681 Body mass index (BMI) 19 or less, adult: Secondary | ICD-10-CM | POA: Diagnosis not present

## 2023-09-17 DIAGNOSIS — Z1211 Encounter for screening for malignant neoplasm of colon: Secondary | ICD-10-CM | POA: Diagnosis not present

## 2023-09-17 DIAGNOSIS — F419 Anxiety disorder, unspecified: Secondary | ICD-10-CM | POA: Diagnosis not present

## 2023-09-17 DIAGNOSIS — Z7185 Encounter for immunization safety counseling: Secondary | ICD-10-CM | POA: Diagnosis not present

## 2023-10-04 DIAGNOSIS — I25119 Atherosclerotic heart disease of native coronary artery with unspecified angina pectoris: Secondary | ICD-10-CM | POA: Diagnosis not present

## 2023-10-04 DIAGNOSIS — I2510 Atherosclerotic heart disease of native coronary artery without angina pectoris: Secondary | ICD-10-CM | POA: Diagnosis not present

## 2023-10-04 DIAGNOSIS — J439 Emphysema, unspecified: Secondary | ICD-10-CM | POA: Diagnosis not present

## 2023-10-04 DIAGNOSIS — I10 Essential (primary) hypertension: Secondary | ICD-10-CM | POA: Diagnosis not present

## 2023-10-07 DIAGNOSIS — I10 Essential (primary) hypertension: Secondary | ICD-10-CM | POA: Diagnosis not present

## 2023-10-07 DIAGNOSIS — I25119 Atherosclerotic heart disease of native coronary artery with unspecified angina pectoris: Secondary | ICD-10-CM | POA: Diagnosis not present

## 2023-10-07 DIAGNOSIS — I2510 Atherosclerotic heart disease of native coronary artery without angina pectoris: Secondary | ICD-10-CM | POA: Diagnosis not present

## 2023-10-07 DIAGNOSIS — J439 Emphysema, unspecified: Secondary | ICD-10-CM | POA: Diagnosis not present

## 2023-10-18 DIAGNOSIS — I2510 Atherosclerotic heart disease of native coronary artery without angina pectoris: Secondary | ICD-10-CM | POA: Diagnosis not present

## 2023-10-18 DIAGNOSIS — J439 Emphysema, unspecified: Secondary | ICD-10-CM | POA: Diagnosis not present

## 2023-10-18 DIAGNOSIS — F419 Anxiety disorder, unspecified: Secondary | ICD-10-CM | POA: Diagnosis not present

## 2023-10-18 DIAGNOSIS — G729 Myopathy, unspecified: Secondary | ICD-10-CM | POA: Diagnosis not present

## 2023-10-18 DIAGNOSIS — Z681 Body mass index (BMI) 19 or less, adult: Secondary | ICD-10-CM | POA: Diagnosis not present

## 2023-10-18 DIAGNOSIS — I10 Essential (primary) hypertension: Secondary | ICD-10-CM | POA: Diagnosis not present

## 2023-10-26 DIAGNOSIS — R197 Diarrhea, unspecified: Secondary | ICD-10-CM | POA: Diagnosis not present

## 2023-10-27 DIAGNOSIS — K219 Gastro-esophageal reflux disease without esophagitis: Secondary | ICD-10-CM | POA: Diagnosis not present

## 2023-10-27 DIAGNOSIS — R197 Diarrhea, unspecified: Secondary | ICD-10-CM | POA: Diagnosis not present

## 2023-10-27 DIAGNOSIS — K921 Melena: Secondary | ICD-10-CM | POA: Diagnosis not present

## 2023-10-27 DIAGNOSIS — R1084 Generalized abdominal pain: Secondary | ICD-10-CM | POA: Diagnosis not present

## 2023-10-28 DIAGNOSIS — K921 Melena: Secondary | ICD-10-CM | POA: Diagnosis not present

## 2023-10-28 DIAGNOSIS — R1084 Generalized abdominal pain: Secondary | ICD-10-CM | POA: Diagnosis not present

## 2023-10-28 DIAGNOSIS — R197 Diarrhea, unspecified: Secondary | ICD-10-CM | POA: Diagnosis not present

## 2023-11-02 DIAGNOSIS — I2510 Atherosclerotic heart disease of native coronary artery without angina pectoris: Secondary | ICD-10-CM | POA: Diagnosis not present

## 2023-11-02 DIAGNOSIS — I10 Essential (primary) hypertension: Secondary | ICD-10-CM | POA: Diagnosis not present

## 2023-11-02 DIAGNOSIS — J439 Emphysema, unspecified: Secondary | ICD-10-CM | POA: Diagnosis not present

## 2023-11-02 DIAGNOSIS — I25119 Atherosclerotic heart disease of native coronary artery with unspecified angina pectoris: Secondary | ICD-10-CM | POA: Diagnosis not present

## 2023-11-08 DIAGNOSIS — R197 Diarrhea, unspecified: Secondary | ICD-10-CM | POA: Diagnosis not present

## 2023-11-08 DIAGNOSIS — R1084 Generalized abdominal pain: Secondary | ICD-10-CM | POA: Diagnosis not present

## 2023-11-11 DIAGNOSIS — F411 Generalized anxiety disorder: Secondary | ICD-10-CM | POA: Diagnosis not present

## 2023-11-11 DIAGNOSIS — I2510 Atherosclerotic heart disease of native coronary artery without angina pectoris: Secondary | ICD-10-CM | POA: Diagnosis not present

## 2023-11-11 DIAGNOSIS — J439 Emphysema, unspecified: Secondary | ICD-10-CM | POA: Diagnosis not present

## 2023-11-11 DIAGNOSIS — I10 Essential (primary) hypertension: Secondary | ICD-10-CM | POA: Diagnosis not present

## 2023-11-11 DIAGNOSIS — F32A Depression, unspecified: Secondary | ICD-10-CM | POA: Diagnosis not present

## 2023-11-11 DIAGNOSIS — I25119 Atherosclerotic heart disease of native coronary artery with unspecified angina pectoris: Secondary | ICD-10-CM | POA: Diagnosis not present

## 2023-11-11 DIAGNOSIS — F3341 Major depressive disorder, recurrent, in partial remission: Secondary | ICD-10-CM | POA: Diagnosis not present

## 2023-11-26 DIAGNOSIS — H524 Presbyopia: Secondary | ICD-10-CM | POA: Diagnosis not present

## 2023-11-26 DIAGNOSIS — H52223 Regular astigmatism, bilateral: Secondary | ICD-10-CM | POA: Diagnosis not present

## 2023-11-26 DIAGNOSIS — H2513 Age-related nuclear cataract, bilateral: Secondary | ICD-10-CM | POA: Diagnosis not present

## 2023-11-26 DIAGNOSIS — H33322 Round hole, left eye: Secondary | ICD-10-CM | POA: Diagnosis not present

## 2023-11-26 DIAGNOSIS — H547 Unspecified visual loss: Secondary | ICD-10-CM | POA: Diagnosis not present

## 2023-11-26 DIAGNOSIS — H5203 Hypermetropia, bilateral: Secondary | ICD-10-CM | POA: Diagnosis not present

## 2023-12-03 DIAGNOSIS — S51802A Unspecified open wound of left forearm, initial encounter: Secondary | ICD-10-CM | POA: Diagnosis not present

## 2023-12-12 DIAGNOSIS — F411 Generalized anxiety disorder: Secondary | ICD-10-CM | POA: Diagnosis not present

## 2023-12-12 DIAGNOSIS — I25119 Atherosclerotic heart disease of native coronary artery with unspecified angina pectoris: Secondary | ICD-10-CM | POA: Diagnosis not present

## 2023-12-12 DIAGNOSIS — I2510 Atherosclerotic heart disease of native coronary artery without angina pectoris: Secondary | ICD-10-CM | POA: Diagnosis not present

## 2023-12-12 DIAGNOSIS — I10 Essential (primary) hypertension: Secondary | ICD-10-CM | POA: Diagnosis not present

## 2023-12-12 DIAGNOSIS — F32A Depression, unspecified: Secondary | ICD-10-CM | POA: Diagnosis not present

## 2023-12-12 DIAGNOSIS — J439 Emphysema, unspecified: Secondary | ICD-10-CM | POA: Diagnosis not present

## 2023-12-12 DIAGNOSIS — F3341 Major depressive disorder, recurrent, in partial remission: Secondary | ICD-10-CM | POA: Diagnosis not present

## 2023-12-17 DIAGNOSIS — K5080 Crohn's disease of both small and large intestine without complications: Secondary | ICD-10-CM | POA: Diagnosis not present

## 2023-12-17 DIAGNOSIS — R197 Diarrhea, unspecified: Secondary | ICD-10-CM | POA: Diagnosis not present

## 2023-12-17 DIAGNOSIS — R1084 Generalized abdominal pain: Secondary | ICD-10-CM | POA: Diagnosis not present

## 2023-12-17 DIAGNOSIS — K921 Melena: Secondary | ICD-10-CM | POA: Diagnosis not present

## 2023-12-17 DIAGNOSIS — K219 Gastro-esophageal reflux disease without esophagitis: Secondary | ICD-10-CM | POA: Diagnosis not present

## 2024-01-05 DIAGNOSIS — K50119 Crohn's disease of large intestine with unspecified complications: Secondary | ICD-10-CM | POA: Diagnosis not present

## 2024-01-05 DIAGNOSIS — K529 Noninfective gastroenteritis and colitis, unspecified: Secondary | ICD-10-CM | POA: Diagnosis not present

## 2024-01-05 DIAGNOSIS — I2510 Atherosclerotic heart disease of native coronary artery without angina pectoris: Secondary | ICD-10-CM | POA: Diagnosis not present

## 2024-01-05 DIAGNOSIS — R197 Diarrhea, unspecified: Secondary | ICD-10-CM | POA: Diagnosis not present

## 2024-01-05 DIAGNOSIS — K5010 Crohn's disease of large intestine without complications: Secondary | ICD-10-CM | POA: Diagnosis not present

## 2024-01-05 DIAGNOSIS — R1084 Generalized abdominal pain: Secondary | ICD-10-CM | POA: Diagnosis not present

## 2024-01-05 DIAGNOSIS — K6389 Other specified diseases of intestine: Secondary | ICD-10-CM | POA: Diagnosis not present

## 2024-01-05 DIAGNOSIS — K633 Ulcer of intestine: Secondary | ICD-10-CM | POA: Diagnosis not present

## 2024-01-05 DIAGNOSIS — I10 Essential (primary) hypertension: Secondary | ICD-10-CM | POA: Diagnosis not present

## 2024-01-05 DIAGNOSIS — K921 Melena: Secondary | ICD-10-CM | POA: Diagnosis not present

## 2024-01-07 DIAGNOSIS — H33322 Round hole, left eye: Secondary | ICD-10-CM | POA: Diagnosis not present

## 2024-01-07 DIAGNOSIS — H43821 Vitreomacular adhesion, right eye: Secondary | ICD-10-CM | POA: Diagnosis not present

## 2024-01-07 DIAGNOSIS — H2513 Age-related nuclear cataract, bilateral: Secondary | ICD-10-CM | POA: Diagnosis not present

## 2024-01-11 DIAGNOSIS — F411 Generalized anxiety disorder: Secondary | ICD-10-CM | POA: Diagnosis not present

## 2024-01-11 DIAGNOSIS — F32A Depression, unspecified: Secondary | ICD-10-CM | POA: Diagnosis not present

## 2024-01-11 DIAGNOSIS — F3341 Major depressive disorder, recurrent, in partial remission: Secondary | ICD-10-CM | POA: Diagnosis not present

## 2024-01-11 DIAGNOSIS — J439 Emphysema, unspecified: Secondary | ICD-10-CM | POA: Diagnosis not present

## 2024-01-11 DIAGNOSIS — I10 Essential (primary) hypertension: Secondary | ICD-10-CM | POA: Diagnosis not present

## 2024-01-11 DIAGNOSIS — I2510 Atherosclerotic heart disease of native coronary artery without angina pectoris: Secondary | ICD-10-CM | POA: Diagnosis not present

## 2024-01-11 DIAGNOSIS — I25119 Atherosclerotic heart disease of native coronary artery with unspecified angina pectoris: Secondary | ICD-10-CM | POA: Diagnosis not present

## 2024-01-20 DIAGNOSIS — K219 Gastro-esophageal reflux disease without esophagitis: Secondary | ICD-10-CM | POA: Diagnosis not present

## 2024-01-20 DIAGNOSIS — K5010 Crohn's disease of large intestine without complications: Secondary | ICD-10-CM | POA: Diagnosis not present

## 2024-01-20 DIAGNOSIS — F17200 Nicotine dependence, unspecified, uncomplicated: Secondary | ICD-10-CM | POA: Diagnosis not present

## 2024-01-20 DIAGNOSIS — F411 Generalized anxiety disorder: Secondary | ICD-10-CM | POA: Diagnosis not present

## 2024-01-20 DIAGNOSIS — R197 Diarrhea, unspecified: Secondary | ICD-10-CM | POA: Diagnosis not present

## 2024-01-31 DIAGNOSIS — I10 Essential (primary) hypertension: Secondary | ICD-10-CM | POA: Diagnosis not present

## 2024-01-31 DIAGNOSIS — I25119 Atherosclerotic heart disease of native coronary artery with unspecified angina pectoris: Secondary | ICD-10-CM | POA: Diagnosis not present

## 2024-01-31 DIAGNOSIS — I2510 Atherosclerotic heart disease of native coronary artery without angina pectoris: Secondary | ICD-10-CM | POA: Diagnosis not present

## 2024-01-31 DIAGNOSIS — J439 Emphysema, unspecified: Secondary | ICD-10-CM | POA: Diagnosis not present

## 2024-02-11 DIAGNOSIS — F3341 Major depressive disorder, recurrent, in partial remission: Secondary | ICD-10-CM | POA: Diagnosis not present

## 2024-02-11 DIAGNOSIS — E782 Mixed hyperlipidemia: Secondary | ICD-10-CM | POA: Diagnosis not present

## 2024-02-11 DIAGNOSIS — J0110 Acute frontal sinusitis, unspecified: Secondary | ICD-10-CM | POA: Diagnosis not present

## 2024-02-11 DIAGNOSIS — K5180 Other ulcerative colitis without complications: Secondary | ICD-10-CM | POA: Diagnosis not present

## 2024-02-11 DIAGNOSIS — I2510 Atherosclerotic heart disease of native coronary artery without angina pectoris: Secondary | ICD-10-CM | POA: Diagnosis not present

## 2024-02-11 DIAGNOSIS — F32A Depression, unspecified: Secondary | ICD-10-CM | POA: Diagnosis not present

## 2024-02-11 DIAGNOSIS — F411 Generalized anxiety disorder: Secondary | ICD-10-CM | POA: Diagnosis not present

## 2024-02-11 DIAGNOSIS — I10 Essential (primary) hypertension: Secondary | ICD-10-CM | POA: Diagnosis not present

## 2024-02-11 DIAGNOSIS — I25119 Atherosclerotic heart disease of native coronary artery with unspecified angina pectoris: Secondary | ICD-10-CM | POA: Diagnosis not present

## 2024-02-11 DIAGNOSIS — G720 Drug-induced myopathy: Secondary | ICD-10-CM | POA: Diagnosis not present

## 2024-02-11 DIAGNOSIS — J439 Emphysema, unspecified: Secondary | ICD-10-CM | POA: Diagnosis not present

## 2024-02-11 DIAGNOSIS — F419 Anxiety disorder, unspecified: Secondary | ICD-10-CM | POA: Diagnosis not present

## 2024-02-29 DIAGNOSIS — M47816 Spondylosis without myelopathy or radiculopathy, lumbar region: Secondary | ICD-10-CM | POA: Diagnosis not present

## 2024-02-29 DIAGNOSIS — G894 Chronic pain syndrome: Secondary | ICD-10-CM | POA: Diagnosis not present

## 2024-02-29 DIAGNOSIS — M5030 Other cervical disc degeneration, unspecified cervical region: Secondary | ICD-10-CM | POA: Diagnosis not present

## 2024-02-29 DIAGNOSIS — I10 Essential (primary) hypertension: Secondary | ICD-10-CM | POA: Diagnosis not present

## 2024-02-29 DIAGNOSIS — M47812 Spondylosis without myelopathy or radiculopathy, cervical region: Secondary | ICD-10-CM | POA: Diagnosis not present

## 2024-02-29 DIAGNOSIS — M461 Sacroiliitis, not elsewhere classified: Secondary | ICD-10-CM | POA: Diagnosis not present

## 2024-03-01 DIAGNOSIS — J439 Emphysema, unspecified: Secondary | ICD-10-CM | POA: Diagnosis not present

## 2024-03-01 DIAGNOSIS — I2510 Atherosclerotic heart disease of native coronary artery without angina pectoris: Secondary | ICD-10-CM | POA: Diagnosis not present

## 2024-03-01 DIAGNOSIS — I10 Essential (primary) hypertension: Secondary | ICD-10-CM | POA: Diagnosis not present

## 2024-03-01 DIAGNOSIS — I25119 Atherosclerotic heart disease of native coronary artery with unspecified angina pectoris: Secondary | ICD-10-CM | POA: Diagnosis not present

## 2024-03-12 DIAGNOSIS — I10 Essential (primary) hypertension: Secondary | ICD-10-CM | POA: Diagnosis not present

## 2024-03-12 DIAGNOSIS — I2510 Atherosclerotic heart disease of native coronary artery without angina pectoris: Secondary | ICD-10-CM | POA: Diagnosis not present

## 2024-03-12 DIAGNOSIS — I25119 Atherosclerotic heart disease of native coronary artery with unspecified angina pectoris: Secondary | ICD-10-CM | POA: Diagnosis not present

## 2024-03-12 DIAGNOSIS — F411 Generalized anxiety disorder: Secondary | ICD-10-CM | POA: Diagnosis not present

## 2024-03-12 DIAGNOSIS — F32A Depression, unspecified: Secondary | ICD-10-CM | POA: Diagnosis not present

## 2024-03-12 DIAGNOSIS — F3341 Major depressive disorder, recurrent, in partial remission: Secondary | ICD-10-CM | POA: Diagnosis not present

## 2024-03-12 DIAGNOSIS — J439 Emphysema, unspecified: Secondary | ICD-10-CM | POA: Diagnosis not present

## 2024-03-16 DIAGNOSIS — Z1330 Encounter for screening examination for mental health and behavioral disorders, unspecified: Secondary | ICD-10-CM | POA: Diagnosis not present

## 2024-03-16 DIAGNOSIS — I2510 Atherosclerotic heart disease of native coronary artery without angina pectoris: Secondary | ICD-10-CM | POA: Diagnosis not present

## 2024-03-16 DIAGNOSIS — I10 Essential (primary) hypertension: Secondary | ICD-10-CM | POA: Diagnosis not present

## 2024-03-16 DIAGNOSIS — R072 Precordial pain: Secondary | ICD-10-CM | POA: Diagnosis not present
